# Patient Record
Sex: Female | Born: 1990
Health system: Southern US, Community
[De-identification: ages and names within clinical notes are randomized; demographics above are authoritative.]

## PROBLEM LIST (undated history)

## (undated) DIAGNOSIS — K219 Gastro-esophageal reflux disease without esophagitis: Secondary | ICD-10-CM

## (undated) HISTORY — PX: BUNIONECTOMY: SHX129

---
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
## 2012-04-19 ENCOUNTER — Encounter: Payer: BC Managed Care – PPO | Admitting: Women's Health

## 2012-04-21 ENCOUNTER — Ambulatory Visit (INDEPENDENT_AMBULATORY_CARE_PROVIDER_SITE_OTHER): Payer: BC Managed Care – PPO | Admitting: Women's Health

## 2012-04-21 ENCOUNTER — Telehealth: Payer: Self-pay | Admitting: Women's Health

## 2012-04-21 ENCOUNTER — Encounter: Payer: Self-pay | Admitting: Women's Health

## 2012-04-21 VITALS — BP 110/70 | Ht 64.0 in | Wt 122.0 lb

## 2012-04-21 DIAGNOSIS — Z23 Encounter for immunization: Secondary | ICD-10-CM

## 2012-04-21 NOTE — Progress Notes (Signed)
Patient ID: Kellie [name], female   DOB: 07-31-1991, 21 y.o.   MRN: [id number] Had an annual exam with STD screen at health department on 04/20/12. Requested gardasil. Had received first gardasil in 2010, did not return for followup. Without complaint. Reviewed gardasil not in, currently on backorder, will call when gardasil arrives. Reviewed gardasil 3 series injection, reviewed importance of returning to office in 2 and 6 months after initial injection.

## 2012-04-21 NOTE — Telephone Encounter (Signed)
Telephone call, informed gardasil arrived, schedule appointment for injection. Again reviewed 3 series injection.

## 2012-04-21 NOTE — Patient Instructions (Addendum)
Health Maintenance, 18- to 19-Year-Old SCHOOL PERFORMANCE After high school completion, the Yvette Loveless adult may be attending college, technical or vocational school, or entering the military or the work force. SOCIAL AND EMOTIONAL DEVELOPMENT The Lennis Korb adult establishes adult relationships and explores sexual identity. Olivine Hiers adults may be living at home or in a college dorm or apartment. Increasing independence is important with Akshith Moncus adults. Throughout adolescence, teens should assume responsibility of their own health care. IMMUNIZATIONS Most Nikolas Casher adults should be fully vaccinated. A booster dose of Tdap (tetanus, diphtheria, and pertussis, or "whooping cough"), a dose of meningococcal vaccine to protect against a certain type of bacterial meningitis, hepatitis A, human papillomarvirus (HPV), chickenpox, or measles vaccines may be indicated, if not given at an earlier age. Annual influenza or "flu" vaccination should be considered during flu season.  TESTING Annual screening for vision and hearing problems is recommended. Vision should be screened objectively at least once between 18 and 19 years of age. The Monserrath Junio adult may be screened for anemia or tuberculosis. Ermon Sagan adults should have a blood test to check for high cholesterol during this time period. Tivon Lemoine adults should be screened for use of alcohol and drugs. If the Gloriann Riede adult is sexually active, screening for sexually transmitted infections, pregnancy, or HIV may be performed. Screening for cervical cancer should be performed within 3 years of beginning sexual activity. NUTRITION AND ORAL HEALTH  Adequate calcium intake is important. Consume 3 servings of low-fat milk and dairy products daily. For those who do not drink milk or consume dairy products, calcium enriched foods, such as juice, bread, or cereal, dark, leafy greens, or canned fish are alternate sources of calcium.   Drink plenty of water. Limit fruit juice to 8 to 12 ounces per day.  Avoid sugary beverages or sodas.   Discourage skipping meals, especially breakfast. Teens should eat a good variety of vegetables and fruits, as well as lean meats.   Avoid high fat, high salt, and high sugar foods, such as candy, chips, and cookies.   Encourage Emillio Ngo adults to participate in meal planning and preparation.   Eat meals together as a family whenever possible. Encourage conversation at mealtime.   Limit fast food choices and eating out at restaurants.   Brush teeth twice a day and floss.   Schedule dental exams twice a year.  SLEEP Regular sleep habits are important. PHYSICAL, SOCIAL, AND EMOTIONAL DEVELOPMENT  One hour of regular physical activity daily is recommended. Continue to participate in sports.   Encourage Trevar Boehringer adults to develop their own interests and consider community service or volunteerism.   Provide guidance to the Delaina Fetsch adult in making decisions about college and work plans.   Make sure that Tynika Luddy adults know that they should never be in a situation that makes them uncomfortable, and they should tell partners if they do not want to engage in sexual activity.   Talk to the Cosby Proby adult about body image. Eating disorders may be noted at this time. Cierrah Dace adults may also be concerned about being overweight. Monitor the Austan Nicholl adult for weight gain or loss.   Mood disturbances, depression, anxiety, alcoholism, or attention problems may be noted in Simora Dingee adults. Talk to the caregiver if there are concerns about mental illness.   Negotiate limit setting and independent decision making.   Encourage the Nataliee Shurtz adult to handle conflict without physical violence.   Avoid loud noises which may impair hearing.   Limit television and computer time to 2 hours per day.   Individuals who engage in excessive sedentary activity are more likely to become overweight.  RISK BEHAVIORS  Sexually active Benzion Mesta adults need to take precautions against pregnancy and sexually  transmitted infections. Talk to Nevin Grizzle adults about contraception.   Provide a tobacco-free and drug-free environment for the Jowell Bossi adult. Talk to the Zanyia Silbaugh adult about drug, tobacco, and alcohol use among friends or at friends' homes. Make sure the Dacen Frayre adult knows that smoking tobacco or marijuana and taking drugs have health consequences and may impact brain development.   Teach the Werner Labella adult about appropriate use of over-the-counter or prescription medicines.   Establish guidelines for driving and for riding with friends.   Talk to Sanika Brosious adults about the risks of drinking and driving or boating. Encourage the Talley Kreiser adult to call you if he or she or friends have been drinking or using drugs.   Remind Caedon Bond adults to wear seat belts at all times in cars and life vests in boats.   Marqueze Ramcharan adults should always wear a properly fitted helmet when they are riding a bicycle.   Use caution with all-terrain vehicles (ATVs) or other motorized vehicles.   Do not keep handguns in the home. (If you do, the gun and ammunition should be locked separately and out of the Weslynn Ke adult's access.)   Equip your home with smoke detectors and change the batteries regularly. Make sure all family members know the fire escape plans for your home.   Teach Kennadee Walthour adults not to swim alone and not to dive in shallow water.   All individuals should wear sunscreen that protects against UVA and UVB light with at least a sun protection factor (SPF) of 30 when out in the sun. This minimizes sun burning.  WHAT'S NEXT? Tymeer Vaquera adults should visit their pediatrician or family physician yearly. By Shaunice Levitan adulthood, health care should be transitioned to a family physician or internal medicine specialist. Sexually active females may want to begin annual physical exams with a gynecologist. Document Released: 01/22/2007 Document Revised: 10/16/2011 Document Reviewed: 02/11/2007 Exit[employer] Patient Information 2012 [employer]. 

## 2012-06-10 ENCOUNTER — Ambulatory Visit (INDEPENDENT_AMBULATORY_CARE_PROVIDER_SITE_OTHER): Payer: BC Managed Care – PPO | Admitting: Women's Health

## 2012-06-10 ENCOUNTER — Encounter: Payer: Self-pay | Admitting: Women's Health

## 2012-06-10 DIAGNOSIS — R350 Frequency of micturition: Secondary | ICD-10-CM

## 2012-06-10 DIAGNOSIS — IMO0001 Reserved for inherently not codable concepts without codable children: Secondary | ICD-10-CM

## 2012-06-10 DIAGNOSIS — Z309 Encounter for contraceptive management, unspecified: Secondary | ICD-10-CM

## 2012-06-10 DIAGNOSIS — N390 Urinary tract infection, site not specified: Secondary | ICD-10-CM

## 2012-06-10 DIAGNOSIS — N898 Other specified noninflammatory disorders of vagina: Secondary | ICD-10-CM

## 2012-06-10 LAB — URINALYSIS W MICROSCOPIC + REFLEX CULTURE
Bilirubin Urine: NEGATIVE
Crystals: NONE SEEN
Ketones, ur: NEGATIVE mg/dL
Specific Gravity, Urine: 1.02 (ref 1.005–1.030)
Urobilinogen, UA: 0.2 mg/dL (ref 0.0–1.0)

## 2012-06-10 LAB — WET PREP FOR TRICH, YEAST, CLUE
Clue Cells Wet Prep HPF POC: NONE SEEN
Yeast Wet Prep HPF POC: NONE SEEN

## 2012-06-10 MED ORDER — SULFAMETHOXAZOLE-TMP DS 800-160 MG PO TABS: 1.0000 | ORAL_TABLET | Freq: Two times a day (BID) | ORAL | Status: DC

## 2012-06-10 MED ORDER — NORETHIN-ETH ESTRAD TRIPHASIC 0.5/1/0.5-35 MG-MCG PO TABS: 1.0000 | ORAL_TABLET | Freq: Every day | ORAL | Status: DC

## 2012-06-10 MED ORDER — FLUCONAZOLE 150 MG PO TABS: 150.0000 mg | ORAL_TABLET | Freq: Once | ORAL | Status: AC

## 2012-06-10 NOTE — Patient Instructions (Addendum)

## 2012-06-10 NOTE — Progress Notes (Signed)
Patient ID: Kellie [name], female   DOB: 11-26-1990, 21 y.o.   MRN: [id number] Presents with the complaint of  increased urinary frequency with pain and burning for several days. Also states is having vaginal discharge with irritation. New partner, had a negative HIV, hepatitis and RPR at the health department but states is not sure if they did cultures. Currently on Tri-Norinyl without a problem. Denies a fever. Recently had bilateral bunionectomy and has been on antibiotics. First gardasil given in June.  Exam: Incision on left foot well approximated and healing, right foot covered and wearing a boot. No CVAT, abdomen soft nontender, external genitalia erythematous at introitus, speculum exam vaginal discharge white without odor, vaginal walls erythematous GC/Chlamydia culture taken. Wet prep negative. Bimanual no CMT or adnexal fullness or tenderness. UA: WBCs TNTC, moderate leukocytes many bacteria.  UTI Symptomatic yeast  Plan: Septra DS one by mouth twice a day for 3 days, Diflucan 150 by mouth x1 dose. Keep scheduled followup with orthopedist. Second  gardasil given, instructed to return to the office in 4 months to complete series. Continue on the Tri-Norinyl, condoms encouraged until permanent partner.

## 2012-06-11 LAB — GC/CHLAMYDIA PROBE AMP, GENITAL: GC Probe Amp, Genital: NEGATIVE

## 2012-06-17 LAB — URINE CULTURE

## 2012-11-05 ENCOUNTER — Ambulatory Visit (INDEPENDENT_AMBULATORY_CARE_PROVIDER_SITE_OTHER): Payer: BC Managed Care – PPO | Admitting: Gynecology

## 2012-11-05 DIAGNOSIS — Z23 Encounter for immunization: Secondary | ICD-10-CM

## 2013-05-26 ENCOUNTER — Ambulatory Visit (INDEPENDENT_AMBULATORY_CARE_PROVIDER_SITE_OTHER): Payer: BC Managed Care – PPO | Admitting: *Deleted

## 2013-05-26 DIAGNOSIS — Z23 Encounter for immunization: Secondary | ICD-10-CM

## 2013-10-10 ENCOUNTER — Encounter: Payer: Self-pay | Admitting: Podiatry

## 2013-10-10 ENCOUNTER — Ambulatory Visit (INDEPENDENT_AMBULATORY_CARE_PROVIDER_SITE_OTHER): Payer: BC Managed Care – PPO

## 2013-10-10 ENCOUNTER — Ambulatory Visit (INDEPENDENT_AMBULATORY_CARE_PROVIDER_SITE_OTHER): Payer: BC Managed Care – PPO | Admitting: Podiatry

## 2013-10-10 VITALS — BP 124/77 | HR 69 | Resp 12 | Ht 65.0 in

## 2013-10-10 DIAGNOSIS — R52 Pain, unspecified: Secondary | ICD-10-CM

## 2013-10-10 DIAGNOSIS — M2040 Other hammer toe(s) (acquired), unspecified foot: Secondary | ICD-10-CM

## 2013-10-10 NOTE — Progress Notes (Signed)
N- SORE, BURNING SORE, SWOLLEN L-LT FOOT 3RD TOE D- 2 WEEKS O-SLOWLY C-BETTER A-WALKING T-N/A

## 2013-10-12 NOTE — Progress Notes (Signed)
Subjective:     Patient ID: Kellie [name], female   DOB: 18-Jan-1991, 22 y.o.   MRN: [id number]  HPI patient presents stating I was having some discomfort in my left foot and third toe and wanted to get it checked. She is several years after having extensive forefoot reconstruction of both feet   Review of Systems     Objective:   Physical Exam Neurovascular status was found to be intact with muscle strength adequate. No equinus condition was noted in all incision sites have healed well   the third toe left is mildly plantar flexed but in a good position in the frontal plane with mild edema associated with it  Assessment:     Possibility of trauma to the third toe which may be creating some irritation    Plan:     H&P and x-ray reviewed. Advised on wider shoes and soaks and symptoms should reduce. If continuing she is to let me know and we will look more into this problem

## 2013-11-15 ENCOUNTER — Encounter: Payer: Self-pay | Admitting: Women's Health

## 2013-11-15 ENCOUNTER — Ambulatory Visit (INDEPENDENT_AMBULATORY_CARE_PROVIDER_SITE_OTHER): Payer: BC Managed Care – PPO | Admitting: Women's Health

## 2013-11-15 DIAGNOSIS — N760 Acute vaginitis: Secondary | ICD-10-CM

## 2013-11-15 DIAGNOSIS — B3731 Acute candidiasis of vulva and vagina: Secondary | ICD-10-CM

## 2013-11-15 DIAGNOSIS — B373 Candidiasis of vulva and vagina: Secondary | ICD-10-CM

## 2013-11-15 DIAGNOSIS — B9689 Other specified bacterial agents as the cause of diseases classified elsewhere: Secondary | ICD-10-CM

## 2013-11-15 DIAGNOSIS — A499 Bacterial infection, unspecified: Secondary | ICD-10-CM

## 2013-11-15 LAB — WET PREP FOR TRICH, YEAST, CLUE
Trich, Wet Prep: NONE SEEN
Yeast Wet Prep HPF POC: NONE SEEN

## 2013-11-15 MED ORDER — FLUCONAZOLE 150 MG PO TABS: 150.0000 mg | ORAL_TABLET | Freq: Once | ORAL | Status: DC

## 2013-11-15 MED ORDER — METRONIDAZOLE 0.75 % VA GEL: VAGINAL | Status: DC

## 2013-11-15 NOTE — Patient Instructions (Signed)
Bacterial Vaginosis Bacterial vaginosis is an infection of the vagina. A healthy vagina has many kinds of good germs (bacteria). Sometimes the number of good germs can change. This allows bad germs to move in and cause an infection. You may be given medicine (antibiotics) to treat the infection. Or, you may not need treatment at all. HOME CARE  Take your medicine as told. Finish them even if you start to feel better.  Do not have sex until you finish your medicine.  Do not douche.  Practice safe sex.  Tell your sex partner that you have an infection. They should see their doctor for treatment if they have problems. GET HELP RIGHT AWAY IF:  You do not get better after 3 days of treatment.  You have grey fluid (discharge) coming from your vagina.  You have pain.  You have a temperature of 102 F (38.9 C) or higher. MAKE SURE YOU:   Understand these instructions.  Will watch your condition.  Will get help right away if you are not doing well or get worse. Document Released: 08/05/2008 Document Revised: 01/19/2012 Document Reviewed: 06/08/2013 [employer] Patient Information 2014 [employer].  

## 2013-11-15 NOTE — Progress Notes (Signed)
Patient ID: Kellie [name], female   DOB: 09/20/1991, 23 y.o.   MRN: [id number] Presents with complaint of vaginal discomfort and bump on perineum that has resolved. Monthly cycle on Ortho Evra patch, difficulty with adherence/using tape to keep adhered. Gardasil series completed. Recent annual exam and treatment for UTI at primary care.  Exam: Appears well. External genitalia slightly erythematous at introitus no visible lesion, swelling, or bump noted. Speculum exam copious white adherent discharge noted with odor. Wet prep positive for amines, clues, and TNTC bacteria. Bimanual no CMT or adnexal fullness or tenderness.  Bacteria vaginosis Contraception management  Plan: MetroGel vaginal cream 1 applicator at bedtime x5, alcohol precautions reviewed, Diflucan 150 by mouth times one dose if vaginal itching occurs. Contraception options reviewed, would like nexplanon , information given and reviewed, Dr. Audie BoxFontaine to place with next cycle.

## 2013-11-30 ENCOUNTER — Encounter: Payer: Self-pay | Admitting: Women's Health

## 2013-11-30 ENCOUNTER — Ambulatory Visit (INDEPENDENT_AMBULATORY_CARE_PROVIDER_SITE_OTHER): Payer: BC Managed Care – PPO | Admitting: Women's Health

## 2013-11-30 VITALS — Ht 65.0 in | Wt 123.6 lb

## 2013-11-30 DIAGNOSIS — Z309 Encounter for contraceptive management, unspecified: Secondary | ICD-10-CM

## 2013-11-30 DIAGNOSIS — IMO0001 Reserved for inherently not codable concepts without codable children: Secondary | ICD-10-CM

## 2013-11-30 MED ORDER — MEDROXYPROGESTERONE ACETATE 150 MG/ML IM SUSP: 150.0000 mg | INTRAMUSCULAR | Status: DC

## 2013-11-30 NOTE — Progress Notes (Signed)
Patient ID: Kellie [name], female   DOB: Oct 14, 1991, 23 y.o.   MRN: [id number] Presents for contraceptive consultation.  Normal monthly cycle/Ortho Evra patch from [facility] department/not sexually active. Would like to start Depo shot. Reports normal annual exam 10/2013 at health department. Gardasil series completed.  Exam:  Appears well.  Contraceptive management  Plan: Depo-Provera 150mg  injection q 12 weeks.  Slight risk of blood clots, wt gain, irregular spotting reviewed.  Prescription given, return to office with cycle, and every 12 weeks.  Encouraged condoms when sexually active

## 2013-11-30 NOTE — Patient Instructions (Signed)

## 2013-12-02 ENCOUNTER — Ambulatory Visit (INDEPENDENT_AMBULATORY_CARE_PROVIDER_SITE_OTHER): Payer: BC Managed Care – PPO | Admitting: Gynecology

## 2013-12-02 DIAGNOSIS — Z309 Encounter for contraceptive management, unspecified: Secondary | ICD-10-CM

## 2013-12-02 DIAGNOSIS — IMO0001 Reserved for inherently not codable concepts without codable children: Secondary | ICD-10-CM

## 2013-12-02 MED ORDER — MEDROXYPROGESTERONE ACETATE 150 MG/ML IM SUSP
150.0000 mg | Freq: Once | INTRAMUSCULAR | Status: AC
  Administered 2013-12-02: 150 mg via INTRAMUSCULAR

## 2014-02-14 ENCOUNTER — Telehealth: Payer: Self-pay

## 2014-02-14 DIAGNOSIS — IMO0001 Reserved for inherently not codable concepts without codable children: Secondary | ICD-10-CM

## 2014-02-14 MED ORDER — MEDROXYPROGESTERONE ACETATE 150 MG/ML IM SUSP: 150.0000 mg | Freq: Once | INTRAMUSCULAR | Status: DC

## 2014-02-14 NOTE — Telephone Encounter (Signed)
Lupita LeashDonna to call her to schedule CE. Looks like there might be time on [place] schedule for 3:00pm on Thursday and she has scheduled a 2:30pm nurse appt. For inj.

## 2014-02-14 NOTE — Telephone Encounter (Signed)
Patient called stating she needs refill on her D-P injection so she can come this Thursday for injection. It appears her last RGCE was 04/21/2012.  She has come regularly for these injections.  Ok to refill in light of so overdue?

## 2014-02-14 NOTE — Telephone Encounter (Signed)
Okay to refill and have her schedule annual exam  day that her depo is due if possible.

## 2014-02-16 ENCOUNTER — Ambulatory Visit: Payer: BC Managed Care – PPO

## 2014-02-16 ENCOUNTER — Encounter: Payer: Self-pay | Admitting: Women's Health

## 2014-02-16 ENCOUNTER — Ambulatory Visit (INDEPENDENT_AMBULATORY_CARE_PROVIDER_SITE_OTHER): Payer: BC Managed Care – PPO | Admitting: Women's Health

## 2014-02-16 VITALS — BP 115/75 | Ht 65.0 in | Wt 121.0 lb

## 2014-02-16 DIAGNOSIS — Z309 Encounter for contraceptive management, unspecified: Secondary | ICD-10-CM

## 2014-02-16 DIAGNOSIS — IMO0001 Reserved for inherently not codable concepts without codable children: Secondary | ICD-10-CM

## 2014-02-16 DIAGNOSIS — Z01419 Encounter for gynecological examination (general) (routine) without abnormal findings: Secondary | ICD-10-CM

## 2014-02-16 LAB — CBC WITH DIFFERENTIAL/PLATELET
Basophils Absolute: 0 10*3/uL (ref 0.0–0.1)
Basophils Relative: 0 % (ref 0–1)
EOS ABS: 0.1 10*3/uL (ref 0.0–0.7)
Eosinophils Relative: 1 % (ref 0–5)
HCT: 37.3 % (ref 36.0–46.0)
HEMOGLOBIN: 13.1 g/dL (ref 12.0–15.0)
LYMPHS ABS: 2.8 10*3/uL (ref 0.7–4.0)
LYMPHS PCT: 44 % (ref 12–46)
MCH: 30.2 pg (ref 26.0–34.0)
MCHC: 35.1 g/dL (ref 30.0–36.0)
MCV: 85.9 fL (ref 78.0–100.0)
MONOS PCT: 6 % (ref 3–12)
Monocytes Absolute: 0.4 10*3/uL (ref 0.1–1.0)
NEUTROS ABS: 3.1 10*3/uL (ref 1.7–7.7)
NEUTROS PCT: 49 % (ref 43–77)
PLATELETS: 211 10*3/uL (ref 150–400)
RBC: 4.34 MIL/uL (ref 3.87–5.11)
RDW: 13.9 % (ref 11.5–15.5)
WBC: 6.3 10*3/uL (ref 4.0–10.5)

## 2014-02-16 MED ORDER — MEDROXYPROGESTERONE ACETATE 150 MG/ML IM SUSP: 150.0000 mg | Freq: Once | INTRAMUSCULAR | Status: DC

## 2014-02-16 MED ORDER — MEDROXYPROGESTERONE ACETATE 150 MG/ML IM SUSP
150.0000 mg | Freq: Once | INTRAMUSCULAR | Status: AC
  Administered 2014-02-16: 150 mg via INTRAMUSCULAR

## 2014-02-16 NOTE — Patient Instructions (Signed)
Health Maintenance, 44- to 21-Year-Old SCHOOL PERFORMANCE After high school completion, the Kellie Kellie [name] adult may be attending college, Hotel manager or vocational school, or entering the [employer] or the work force. SOCIAL AND EMOTIONAL DEVELOPMENT The Kellie Kellie [name] adult establishes adult relationships and explores sexual identity. Kellie Kellie [name] Kellie [name] may be living at home or in a college dorm or apartment. Increasing independence is important with Kellie Kellie [name] Kellie [name]. Throughout these years, Kellie Kellie [name] should assume responsibility of their own health care. RECOMMENDED IMMUNIZATIONS  Influenza vaccine.  All Kellie [name] should be immunized every year.  All Kellie [name], including pregnant women and people with hives-only allergy to eggs can receive the inactivated influenza (IIV) vaccine.  Kellie [name] aged 44 49 years can receive the recombinant influenza (RIV) vaccine. The RIV vaccine does not contain any egg protein.  Tetanus, diphtheria, and acellular pertussis (Td, Tdap) vaccine.  Pregnant women should receive 1 dose of Tdap vaccine during each pregnancy. The dose should be obtained regardless of the length of time since the last dose. Immunization is preferred during the 27th to 36th week of gestation.  An adult who has not previously received Tdap or who does not know his or her vaccine status should receive 1 dose of Tdap. This initial dose should be followed by tetanus and diphtheria toxoids (Td) booster doses every 10 years.  Kellie [name] with an unknown or incomplete history of completing a 3-dose immunization series with Td-containing vaccines should begin or complete a primary immunization series including a Tdap dose.  Kellie [name] should receive a Td booster every 10 years.  Varicella vaccine.  An adult without evidence of immunity to varicella should receive 2 doses or a second dose if he or she has previously received 1 dose.  Pregnant females who do not have evidence of immunity should receive the first dose after pregnancy.  This first dose should be obtained before leaving the health care facility. The second dose should be obtained 4 8 weeks after the first dose.  Human papillomavirus (HPV) vaccine.  Females aged 15 26 years who have not received the vaccine previously should obtain the 3-dose series.  The vaccine is not recommended for use in pregnant females. However, pregnancy testing is not needed before receiving a dose. If a female is found to be pregnant after receiving a dose, no treatment is needed. In that case, the remaining doses should be delayed until after the pregnancy.  Males aged 12 21 years who have not received the vaccine previously should receive the 3-dose series. Males aged 39 26 years may be immunized.  Immunization is recommended through the age of 1 years for any female who has sex with males and did not get any or all doses earlier.  Immunization is recommended for any person with an immunocompromised condition through the age of 27 years if he or she did not get any or all doses earlier.  During the 3-dose series, the second dose should be obtained 4 8 weeks after the first dose. The third dose should be obtained 24 weeks after the first dose and 16 weeks after the second dose.  Measles, mumps, and rubella (MMR) vaccine.  Kellie [name] born in [birth year] or later should have 1 or more doses of MMR vaccine unless there is a contraindication to the vaccine or there is laboratory evidence of immunity to each of the three diseases.  A routine second dose of MMR vaccine should be obtained at least 28 days after the first dose for students attending postsecondary schools, health care workers, or international travelers.  For females of childbearing age, rubella immunity should be determined. If there is no evidence of immunity, females who are not pregnant should be vaccinated. If there is no evidence of immunity, females who are pregnant should delay immunization until after pregnancy.  Pneumococcal  13-valent conjugate (PCV13) vaccine.  When indicated, a person who is uncertain of his or her immunization history and has no record of immunization should receive the PCV13 vaccine.  An adult aged 19 years or older who has certain medical conditions and has not been previously immunized should receive 1 dose of PCV13 vaccine. This PCV13 should be followed with a dose of pneumococcal polysaccharide (PPSV23) vaccine. The PPSV23 vaccine dose should be obtained at least 8 weeks after the dose of PCV13 vaccine.  An adult aged 19 years or older who has certain medical conditions and previously received 1 or more doses of PPSV23 vaccine should receive 1 dose of PCV13. The PCV13 vaccine dose should be obtained 1 or more years after the last PPSV23 vaccine dose.  Pneumococcal polysaccharide (PPSV23) vaccine.  When PCV13 is also indicated, PCV13 should be obtained first.  An adult younger than age 65 years who has certain medical conditions should be immunized.  Any person who resides in a nursing home or long-term care facility should be immunized.  An adult smoker should be immunized.  People with an immunocompromised condition and certain other conditions should receive both PCV13 and PPSV23 vaccines.  People with human immunodeficiency virus (HIV) infection should be immunized as soon as possible after diagnosis.  Immunization during chemotherapy or radiation therapy should be avoided.  Routine use of PPSV23 vaccine is not recommended for American Indians, [place] Natives, or people younger than 65 years unless there are medical conditions that require PPSV23 vaccine.  When indicated, people who have unknown immunization and have no record of immunization should receive PPSV23 vaccine.  One-time revaccination 5 years after the first dose of PPSV23 is recommended for people aged 19 64 years who have chronic kidney failure, nephrotic syndrome, asplenia, or immunocompromised  conditions.  Meningococcal vaccine.  Kellie [name] with asplenia or persistent complement component deficiencies should receive 2 doses of quadrivalent meningococcal conjugate (MenACWY-D) vaccine. The doses should be obtained at least 2 months apart.  Microbiologists working with certain meningococcal bacteria, military recruits, people at risk during an outbreak, and people who travel to or live in countries with a high rate of meningitis should be immunized.  A first-year college student up through age 21 years who is living in a residence hall should receive a dose if he or she did not receive a dose on or after his or her 16th birthday.  Kellie [name] who have certain high-risk conditions should receive one or more doses of vaccine.  Hepatitis A vaccine.  Kellie [name] who wish to be protected from this disease, have certain high-risk conditions, work with hepatitis A-infected animals, work in hepatitis A research labs, or travel to or work in countries with a high rate of hepatitis A should be immunized.  Kellie [name] who were previously unvaccinated and who anticipate close contact with an international adoptee during the first 60 days after arrival in the [place] from a country with a high rate of hepatitis A should be immunized.  Hepatitis B vaccine.  Kellie [name] who wish to be protected from this disease, have certain high-risk conditions, may be exposed to blood or other infectious body fluids, are household contacts or sex partners of hepatitis B positive people, are clients or workers in   certain care facilities, or travel to or work in countries with a high rate of hepatitis B should be immunized.  Haemophilus influenzae type b (Hib) vaccine.  A previously unvaccinated person with asplenia or sickle cell disease or having a scheduled splenectomy should receive 1 dose of Hib vaccine.  Regardless of previous immunization, a recipient of a hematopoietic stem cell transplant should receive a 3-dose series 6  12 months after his or her successful transplant.  Hib vaccine is not recommended for Kellie [name] with HIV infection. TESTING Annual screening for vision and hearing problems is recommended. Vision should be screened objectively at least once between 18 21 years of age. The Kellie Kellie [name] adult may be screened for anemia or tuberculosis. Kellie Kellie [name] Kellie [name] should have a blood test to check for high cholesterol during this time period. Kellie Kellie [name] Kellie [name] should be screened for use of alcohol and drugs. If the Kellie Kellie [name] adult is sexually active, screening for sexually transmitted infections, pregnancy, or HIV may be performed.  NUTRITION AND ORAL HEALTH  Adequate calcium intake is important. Consume 3 servings of low-fat milk and dairy products daily. For those who do not drink milk or consume dairy products, calcium enriched foods, such as juice, bread, or cereal, dark, leafy greens, or canned fish are alternate sources of calcium.  Drink plenty of water. Limit fruit juice to 8 12 ounces (240 360 mL) each day. Avoid sugary beverages or sodas.  Discourage skipping meals, especially breakfast. Kellie Kellie [name] Kellie [name] should eat a good variety of vegetables and fruits, as well as lean meats.  Avoid foods high in fat, salt, or sugar, such as candy, chips, and cookies.  Encourage Kellie Kellie [name] Kellie [name] to participate in meal planning and preparation.  Eat meals together as a family whenever possible. Encourage conversation at mealtime.  Limit fast food choices and eating out at restaurants.  Brush teeth twice a day and floss.  Schedule dental exams twice a year. SLEEP Regular sleep habits are important. PHYSICAL, SOCIAL, AND EMOTIONAL DEVELOPMENT  One hour of regular physical activity daily is recommended. Continue to participate in sports.  Encourage Kellie Kellie [name] Kellie [name] to develop their own interests and consider community service or volunteerism.  Provide guidance to the Kellie Kellie [name] adult in making decisions about college and work plans.  Make sure  that Kellie Kellie [name] Kellie [name] know that they should never be in a situation that makes them uncomfortable, and they should tell partners if they do not want to engage in sexual activity.  Talk to the Kellie Kellie [name] adult about body image. Eating disorders may be noted at this time. Kellie Kellie [name] Kellie [name] may also be concerned about being overweight. Monitor the Kellie Kellie [name] [place] adult for weight gain or loss.  Mood disturbances, depression, anxiety, alcoholism, or attention problems may be noted in Emslee Lopezmartinez Kellie [name]. Talk to the caregiver if there are concerns about mental illness.  Negotiate limit setting and independent decision making.  Encourage the Nekeya Briski adult to handle conflict without physical violence.  Avoid loud noises which may impair hearing.  Limit television and computer time to 2 hours each day. Individuals who engage in excessive sedentary activity are more likely to become overweight. RISK BEHAVIORS  Sexually active Xaiden Fleig Kellie [name] need to take precautions against pregnancy and sexually transmitted infections. Talk to Cathryn Gallery Kellie [name] about contraception.  Provide a tobacco-free and drug-free environment for the Nomar Broad adult. Talk to the Odie Rauen adult about drug, tobacco, and alcohol use among friends or at friend's homes. Make sure the Kristelle Cavallaro adult knows that smoking tobacco or marijuana and taking drugs have health consequences and   may impact brain development.  Teach the Crew Goren adult about appropriate use of over-the-counter or prescription medicines.  Establish guidelines for driving and for riding with friends.  Talk to Krisalyn Yankowski Kellie [name] about the risks of drinking and driving or boating. Encourage the Uma Jerde adult to call you if he or she or friends have been drinking or using drugs.  Remind Gracin Mcpartland Kellie [name] to wear seat belts at all times in cars and life vests in boats.  Marvia Troost Kellie [name] should always wear a properly fitted helmet when they are riding a bicycle.  Use caution with all-terrain vehicles (ATVs) or other motorized  vehicles.  Do not keep handguns in the home. (If you do, the gun and ammunition should be locked separately and out of the Desarai Barrack adult's access.)  Equip your home with smoke detectors and change the batteries regularly. Make sure all family members know the fire escape plans for your home.  Teach Waunetta Riggle Kellie [name] not to swim alone and not to dive in shallow water.  All individuals should wear sunscreen when out in the sun. This minimizes sunburning. WHAT'S NEXT? Adayah Arocho Kellie [name] should visit their pediatrician or family physician yearly. By Annemarie Sebree adulthood, health care should be transitioned to a family physician or internal medicine specialist. Sexually active females may want to begin annual physical exams with a gynecologist. Document Released: 01/22/2007 Document Revised: 02/21/2013 Document Reviewed: 02/11/2007 Exit[employer] Patient Information 2014 [employer].  

## 2014-02-16 NOTE — Progress Notes (Signed)
Kellie [name] [birth year] [id number]    History:    Presents for annual exam.  Occasional bleeding with Depo-Provera. Same partner. Gardasil series completed.  Past medical history, past surgical history, family history and social history were all reviewed and documented in the EPIC chart. 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 Native American [place], denies immediate family  history of diabetes.  ROS:  A  ROS was performed and pertinent positives and negatives are included.  Exam:  Filed Vitals:   02/16/14 1513  BP: 115/75    General appearance:  Normal Thyroid:  Symmetrical, normal in size, without palpable masses or nodularity. Respiratory  Auscultation:  Clear without wheezing or rhonchi Cardiovascular  Auscultation:  Regular rate, without rubs, murmurs or gallops  Edema/varicosities:  Not grossly evident Abdominal  Soft,nontender, without masses, guarding or rebound.  Liver/spleen:  No organomegaly noted  Hernia:  None appreciated  Skin  Inspection:  Grossly normal   Breasts: Examined lying and sitting.     Right: Without masses, retractions, discharge or axillary adenopathy.     Left: Without masses, retractions, discharge or axillary adenopathy. Gentitourinary   Inguinal/mons:  Normal without inguinal adenopathy  External genitalia:  Normal  BUS/Urethra/Skene's glands:  Normal  Vagina:  Normal  Cervix:  Normal  Uterus:  normal in size, shape and contour.  Midline and mobile  Adnexa/parametria:     Rt: Without masses or tenderness.   Lt: Without masses or tenderness.  Anus and perineum: Normal   Assessment/Plan:  23 y.o. SHF G0 for annual exam with no complaints.  Rare bleeding with Depo-Provera  Plan: Depo-Provera 150 mg IM every 12 weeks prescription, proper use, reviewed importance of increasing calcium rich foods and diet, calcium supplement while on Depo-Provera. Given today right hip. SBE's, regular exercise, MVI daily encouraged.  CBC, UA. Dating and driving safety reviewed.    Harrington Challengerancy J Aaro Meyers [facility] LLCWHNP, 5:45 PM 02/16/2014

## 2014-02-17 LAB — URINALYSIS W MICROSCOPIC + REFLEX CULTURE
Bacteria, UA: NONE SEEN
Bilirubin Urine: NEGATIVE
CASTS: NONE SEEN
CRYSTALS: NONE SEEN
GLUCOSE, UA: NEGATIVE mg/dL
Hgb urine dipstick: NEGATIVE
Ketones, ur: NEGATIVE mg/dL
LEUKOCYTES UA: NEGATIVE
NITRITE: NEGATIVE
PH: 7.5 (ref 5.0–8.0)
Protein, ur: NEGATIVE mg/dL
SPECIFIC GRAVITY, URINE: 1.008 (ref 1.005–1.030)
SQUAMOUS EPITHELIAL / LPF: NONE SEEN
Urobilinogen, UA: 1 mg/dL (ref 0.0–1.0)

## 2014-05-08 ENCOUNTER — Telehealth: Payer: Self-pay | Admitting: *Deleted

## 2014-05-08 NOTE — Telephone Encounter (Signed)
Pt called requesting refill on depo provera, pt has refills at pharmacy for a full year until next annual. I tried to call pt to inform, but her voicemail was not set up to leave message.

## 2014-05-11 ENCOUNTER — Ambulatory Visit (INDEPENDENT_AMBULATORY_CARE_PROVIDER_SITE_OTHER): Payer: BC Managed Care – PPO | Admitting: Anesthesiology

## 2014-05-11 DIAGNOSIS — Z3042 Encounter for surveillance of injectable contraceptive: Secondary | ICD-10-CM

## 2014-05-11 DIAGNOSIS — Z3049 Encounter for surveillance of other contraceptives: Secondary | ICD-10-CM

## 2014-05-11 MED ORDER — MEDROXYPROGESTERONE ACETATE 150 MG/ML IM SUSP
150.0000 mg | Freq: Once | INTRAMUSCULAR | Status: AC
  Administered 2014-05-11: 150 mg via INTRAMUSCULAR

## 2014-07-27 ENCOUNTER — Ambulatory Visit (INDEPENDENT_AMBULATORY_CARE_PROVIDER_SITE_OTHER): Payer: BC Managed Care – PPO | Admitting: *Deleted

## 2014-07-27 DIAGNOSIS — Z3042 Encounter for surveillance of injectable contraceptive: Secondary | ICD-10-CM

## 2014-07-27 DIAGNOSIS — Z3049 Encounter for surveillance of other contraceptives: Secondary | ICD-10-CM

## 2014-07-27 MED ORDER — MEDROXYPROGESTERONE ACETATE 150 MG/ML IM SUSP
150.0000 mg | Freq: Once | INTRAMUSCULAR | Status: AC
  Administered 2014-07-27: 150 mg via INTRAMUSCULAR

## 2014-10-23 ENCOUNTER — Ambulatory Visit (INDEPENDENT_AMBULATORY_CARE_PROVIDER_SITE_OTHER): Payer: BC Managed Care – PPO | Admitting: Anesthesiology

## 2014-10-23 DIAGNOSIS — Z3042 Encounter for surveillance of injectable contraceptive: Secondary | ICD-10-CM

## 2014-10-23 MED ORDER — MEDROXYPROGESTERONE ACETATE 150 MG/ML IM SUSP
150.0000 mg | Freq: Once | INTRAMUSCULAR | Status: AC
  Administered 2014-10-23: 150 mg via INTRAMUSCULAR

## 2014-11-28 ENCOUNTER — Encounter (HOSPITAL_COMMUNITY): Payer: Self-pay | Admitting: *Deleted

## 2014-11-28 ENCOUNTER — Emergency Department (HOSPITAL_COMMUNITY)
Admission: EM | Admit: 2014-11-28 | Discharge: 2014-11-28 | Disposition: A | Discharge status: Home / Self Care | Payer: BLUE CROSS/BLUE SHIELD | Attending: Emergency Medicine | Admitting: Emergency Medicine

## 2014-11-28 DIAGNOSIS — R112 Nausea with vomiting, unspecified: Secondary | ICD-10-CM | POA: Insufficient documentation

## 2014-11-28 DIAGNOSIS — H6691 Otitis media, unspecified, right ear: Secondary | ICD-10-CM

## 2014-11-28 DIAGNOSIS — J3489 Other specified disorders of nose and nasal sinuses: Secondary | ICD-10-CM | POA: Insufficient documentation

## 2014-11-28 DIAGNOSIS — H9201 Otalgia, right ear: Secondary | ICD-10-CM | POA: Diagnosis present

## 2014-11-28 DIAGNOSIS — F419 Anxiety disorder, unspecified: Secondary | ICD-10-CM | POA: Insufficient documentation

## 2014-11-28 DIAGNOSIS — J029 Acute pharyngitis, unspecified: Secondary | ICD-10-CM | POA: Diagnosis not present

## 2014-11-28 MED ORDER — ONDANSETRON HCL 4 MG PO TABS
4.0000 mg | ORAL_TABLET | Freq: Once | ORAL | Status: AC
  Administered 2014-11-28: 4 mg via ORAL
  Filled 2014-11-28: qty 1

## 2014-11-28 MED ORDER — HYDROCODONE-ACETAMINOPHEN 5-325 MG PO TABS: 1.0000 | ORAL_TABLET | ORAL | Status: DC | PRN

## 2014-11-28 MED ORDER — HYDROCODONE-ACETAMINOPHEN 5-325 MG PO TABS
1.0000 | ORAL_TABLET | Freq: Once | ORAL | Status: AC
  Administered 2014-11-28: 1 via ORAL
  Filled 2014-11-28: qty 1

## 2014-11-28 MED ORDER — AMOXICILLIN-POT CLAVULANATE 875-125 MG PO TABS: 1.0000 | ORAL_TABLET | Freq: Two times a day (BID) | ORAL | Status: DC

## 2014-11-28 NOTE — ED Notes (Signed)
After signing d/c summary patient requesting something else for pain, stating "what you gave me eased some of the pain but not much. Explained to patient medication would take from 30-60 minutes for desired affect. Informed patient this writer would ask PA-C about additional pain relief. Patient verbally upset "just forget it, don't worry about it". Patient ambulatory, accompanied by mother upon d/c in NAD.

## 2014-11-28 NOTE — ED Notes (Signed)
Pt reports acute onset of rt ear pain that began at 0230 this morning. Pt admits to x2 episodes of n/v en route however states it is related to the pain. Pt's mother states she gave her a dose of her "old amoxicillin" at home prior to arrival. Pt unsure of fever - admits to recent cough and congestion as well.

## 2014-11-28 NOTE — Discharge Instructions (Signed)

## 2014-11-28 NOTE — ED Provider Notes (Signed)
CSN: [id number]     Arrival date & time 11/28/14  0441 History   First MD Initiated Contact with Patient 11/28/14 0445     Chief Complaint  Patient presents with  . Otalgia    (Consider location/radiation/quality/duration/timing/severity/associated sxs/prior Treatment) HPI Comments: Patient is a 24 year old female who presents to the emergency department for further evaluation of right ear pain which began suddenly at 0230. Patient states her symptoms have been preceded by nasal congestion, rhinorrhea, and sore throat as well as cough. She states that she took a dose of an old amoxicillin prescription prior to arrival for symptoms with no relief. She had 2 episodes of nonbloody emesis as a result of worsening pain. No further emesis since arrival to ED, though patient continues to complain of mild nausea. No associated fever, ear discharge, swelling of the outer ear, inability to swallow, or drooling.  Patient is a 24 y.o. female presenting with ear pain. The history is provided by the patient. No language interpreter was used.  Otalgia Location:  Right Behind ear:  No abnormality Quality:  Sharp and throbbing Severity:  Moderate Onset quality:  Sudden Duration:  3 hours Timing:  Constant Progression:  Unchanged Chronicity:  New Relieved by:  Nothing Worsened by:  Palpation Ineffective treatments: "old amoxicillin" Rx. Associated symptoms: congestion, cough, rhinorrhea, sore throat and vomiting   Associated symptoms: no abdominal pain, no ear discharge, no fever, no rash and no tinnitus     History reviewed. No pertinent past medical history. Past Surgical History  Procedure Laterality Date  . Foot surgery     Family History  Problem Relation Age of Onset  . Cancer Maternal Aunt     ovarian  . Diabetes Paternal Aunt   . Breast cancer Paternal Aunt   . Diabetes Paternal Uncle   . Diabetes Maternal Grandmother   . Cancer Maternal Grandmother     colon  . Heart disease  Maternal Grandmother   . Heart disease Maternal Grandfather   . Diabetes Paternal Grandmother   . Heart disease Paternal Grandmother   . Diabetes Paternal Grandfather   . Heart disease Paternal Grandfather   . Breast cancer Paternal Aunt    History  Substance Use Topics  . Smoking status: Never Smoker   . Smokeless tobacco: Never Used  . Alcohol Use: No     Comment: social   OB History    Gravida Para Term Preterm AB TAB SAB Ectopic Multiple Living   0               Review of Systems  Constitutional: Negative for fever.  HENT: Positive for congestion, ear pain, rhinorrhea and sore throat. Negative for ear discharge and tinnitus.   Respiratory: Positive for cough.   Gastrointestinal: Positive for nausea and vomiting. Negative for abdominal pain.  Musculoskeletal: Negative for neck stiffness.  Skin: Negative for rash.  All other systems reviewed and are negative.   Allergies  Review of patient's allergies indicates no known allergies.  Home Medications   Prior to Admission medications   Medication Sig Start Date End Date Taking? Authorizing Provider  amoxicillin-clavulanate (AUGMENTIN) 875-125 MG per tablet Take 1 tablet by mouth every 12 (twelve) hours. 11/28/14   Antony MaduraKelly Drayke Grabel, PA-C  HYDROcodone-acetaminophen (NORCO/VICODIN) 5-325 MG per tablet Take 1 tablet by mouth every 4 (four) hours as needed for moderate pain (Take every 4-6 hours for pain). 11/28/14   Antony MaduraKelly Natalie Mceuen, PA-C  ibuprofen (ADVIL,MOTRIN) 200 MG tablet Take 200 mg by mouth every  6 (six) hours as needed. For pain    Historical Provider, MD  medroxyPROGESTERone (DEPO-PROVERA) 150 MG/ML injection Inject 1 mL (150 mg total) into the muscle once. 02/16/14   Harrington Challenger, NP   BP 132/91 mmHg  Pulse 73  Temp(Src) 97.8 F (36.6 C) (Oral)  Resp 20  SpO2 100%   Physical Exam  Constitutional: She is oriented to person, place, and time. She appears well-developed and well-nourished. No distress.  HENT:  Head:  Normocephalic and atraumatic.  Right Ear: External ear normal. No swelling. No mastoid tenderness. Tympanic membrane is erythematous and bulging. Tympanic membrane is not scarred, not perforated and not retracted. A middle ear effusion is present.  Left Ear: Tympanic membrane, external ear and ear canal normal. No swelling. No mastoid tenderness.  Audible nasal congestion with patent nares b/l  Eyes: Conjunctivae and EOM are normal. No scleral icterus.  Neck: Normal range of motion.  No nuchal rigidity or meningismus  Cardiovascular: Normal rate, regular rhythm and normal heart sounds.   Pulmonary/Chest: Effort normal and breath sounds normal. No respiratory distress. She has no wheezes. She has no rales.  Respirations even and unlabored. Lungs clear.  Musculoskeletal: Normal range of motion.  Neurological: She is alert and oriented to person, place, and time. She exhibits normal muscle tone. Coordination normal.  Skin: Skin is warm and dry. No rash noted. She is not diaphoretic. No erythema. No pallor.  Psychiatric: Her behavior is normal. Her mood appears anxious. Her speech is rapid and/or pressured.  Nursing note and vitals reviewed.   ED Course  Procedures (including critical care time) Labs Review Labs Reviewed - No data to display  Imaging Review No results found.   EKG Interpretation None      MDM   Final diagnoses:  Acute right otitis media, recurrence not specified, unspecified otitis media type    Patient presents with otalgia and exam consistent with acute otitis media; likely secondary to preceding URI symptoms. No concern for acute mastoiditis, meningitis. No antibiotic use in the last month. Patient discharged home with Augmentin. Advised parents to call pediatrician today for follow-up. I have also discussed reasons to return immediately to the ER. Patient expresses understanding and agrees with plan.   Filed Vitals:   11/28/14 0452  BP: 132/91  Pulse: 73   Temp: 97.8 F (36.6 C)  TempSrc: Oral  Resp: 20  SpO2: 100%          Antony Madura, PA-C 11/28/14 1610  Ward Givens, MD 11/28/14 [id number]

## 2015-01-04 ENCOUNTER — Other Ambulatory Visit: Payer: Self-pay

## 2015-01-04 DIAGNOSIS — Z3009 Encounter for other general counseling and advice on contraception: Secondary | ICD-10-CM

## 2015-01-04 MED ORDER — MEDROXYPROGESTERONE ACETATE 150 MG/ML IM SUSP: 150.0000 mg | Freq: Once | INTRAMUSCULAR | Status: DC

## 2015-01-15 ENCOUNTER — Encounter: Payer: Self-pay | Admitting: Internal Medicine

## 2015-01-15 ENCOUNTER — Other Ambulatory Visit: Payer: Self-pay

## 2015-01-16 ENCOUNTER — Other Ambulatory Visit (INDEPENDENT_AMBULATORY_CARE_PROVIDER_SITE_OTHER): Payer: BLUE CROSS/BLUE SHIELD

## 2015-01-16 DIAGNOSIS — Z3042 Encounter for surveillance of injectable contraceptive: Secondary | ICD-10-CM | POA: Diagnosis not present

## 2015-01-16 MED ORDER — MEDROXYPROGESTERONE ACETATE 150 MG/ML IM SUSP
150.0000 mg | Freq: Once | INTRAMUSCULAR | Status: AC
  Administered 2015-01-16: 150 mg via INTRAMUSCULAR

## 2015-02-19 ENCOUNTER — Other Ambulatory Visit: Payer: Self-pay

## 2015-02-20 ENCOUNTER — Other Ambulatory Visit (HOSPITAL_COMMUNITY)
Admission: RE | Admit: 2015-02-20 | Discharge: 2015-02-20 | Disposition: A | Discharge status: Home / Self Care | Payer: BLUE CROSS/BLUE SHIELD | Source: Ambulatory Visit | Attending: Gynecology | Admitting: Gynecology

## 2015-02-20 ENCOUNTER — Ambulatory Visit (INDEPENDENT_AMBULATORY_CARE_PROVIDER_SITE_OTHER): Payer: BLUE CROSS/BLUE SHIELD | Admitting: Women's Health

## 2015-02-20 ENCOUNTER — Encounter: Payer: Self-pay | Admitting: Women's Health

## 2015-02-20 VITALS — BP 124/80 | Ht 65.0 in | Wt 121.0 lb

## 2015-02-20 DIAGNOSIS — Z01419 Encounter for gynecological examination (general) (routine) without abnormal findings: Secondary | ICD-10-CM

## 2015-02-20 DIAGNOSIS — B373 Candidiasis of vulva and vagina: Secondary | ICD-10-CM

## 2015-02-20 DIAGNOSIS — A499 Bacterial infection, unspecified: Secondary | ICD-10-CM | POA: Diagnosis not present

## 2015-02-20 DIAGNOSIS — N760 Acute vaginitis: Secondary | ICD-10-CM

## 2015-02-20 DIAGNOSIS — Z3009 Encounter for other general counseling and advice on contraception: Secondary | ICD-10-CM

## 2015-02-20 DIAGNOSIS — B3731 Acute candidiasis of vulva and vagina: Secondary | ICD-10-CM

## 2015-02-20 DIAGNOSIS — B9689 Other specified bacterial agents as the cause of diseases classified elsewhere: Secondary | ICD-10-CM

## 2015-02-20 LAB — CBC WITH DIFFERENTIAL/PLATELET
BASOS ABS: 0 10*3/uL (ref 0.0–0.1)
Basophils Relative: 0 % (ref 0–1)
Eosinophils Absolute: 0.2 10*3/uL (ref 0.0–0.7)
Eosinophils Relative: 2 % (ref 0–5)
HCT: 40.3 % (ref 36.0–46.0)
Hemoglobin: 13.8 g/dL (ref 12.0–15.0)
LYMPHS PCT: 30 % (ref 12–46)
Lymphs Abs: 2.3 10*3/uL (ref 0.7–4.0)
MCH: 29.3 pg (ref 26.0–34.0)
MCHC: 34.2 g/dL (ref 30.0–36.0)
MCV: 85.6 fL (ref 78.0–100.0)
MPV: 11.1 fL (ref 8.6–12.4)
Monocytes Absolute: 0.6 10*3/uL (ref 0.1–1.0)
Monocytes Relative: 8 % (ref 3–12)
NEUTROS ABS: 4.6 10*3/uL (ref 1.7–7.7)
NEUTROS PCT: 60 % (ref 43–77)
PLATELETS: 206 10*3/uL (ref 150–400)
RBC: 4.71 MIL/uL (ref 3.87–5.11)
RDW: 13.4 % (ref 11.5–15.5)
WBC: 7.7 10*3/uL (ref 4.0–10.5)

## 2015-02-20 LAB — WET PREP FOR TRICH, YEAST, CLUE: Trich, Wet Prep: NONE SEEN

## 2015-02-20 MED ORDER — FLUCONAZOLE 150 MG PO TABS: 150.0000 mg | ORAL_TABLET | Freq: Once | ORAL | Status: DC

## 2015-02-20 MED ORDER — MEDROXYPROGESTERONE ACETATE 150 MG/ML IM SUSP: 150.0000 mg | Freq: Once | INTRAMUSCULAR | Status: DC

## 2015-02-20 MED ORDER — METRONIDAZOLE 0.75 % VA GEL: VAGINAL | Status: DC

## 2015-02-20 NOTE — Progress Notes (Signed)
Kellie [name] 1990-12-19 [id number]    History:    Presents for annual exam.  Amenorrhea Depo-Provera. Not sexually active, negative STD screen with past partner. Gardasil series completed.  Past medical history, past surgical history, family history and social history were all reviewed and documented in the EPIC chart. 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   ROS:  A ROS was performed and pertinent positives and negatives are included.  Exam:  Filed Vitals:   02/20/15 1451  BP: 124/80    General appearance:  Normal Thyroid:  Symmetrical, normal in size, without palpable masses or nodularity. Respiratory  Auscultation:  Clear without wheezing or rhonchi Cardiovascular  Auscultation:  Regular rate, without rubs, murmurs or gallops  Edema/varicosities:  Not grossly evident Abdominal  Soft,nontender, without masses, guarding or rebound.  Liver/spleen:  No organomegaly noted  Hernia:  None appreciated  Skin  Inspection:  Grossly normal   Breasts: Examined lying and sitting.     Right: Without masses, retractions, discharge or axillary adenopathy.     Left: Without masses, retractions, discharge or axillary adenopathy. Gentitourinary   Inguinal/mons:  Normal without inguinal adenopathy  External genitalia:  Normal  BUS/Urethra/Skene's glands:  Normal  Vagina:  Moderate white adherent discharge, wet prep positive for yeast, amines, clues, TNTC bacteria  Cervix:  Normal  Uterus:   normal in size, shape and contour.  Midline and mobile  Adnexa/parametria:     Rt: Without masses or tenderness.   Lt: Without masses or tenderness.  Anus and perineum: Normal   Assessment/Plan:  24 y.o. S WF G0 for annual exam with complaint of vaginal odor.   Amenorrheic on Depo-Provera Bacteria vaginosis and yeast  Plan: MetroGel vaginal cream 1 applicator at bedtime 5, alcohol precautions reviewed, Diflucan 150 by mouth 1 dose, call if no  relief of discharge. Condoms encouraged if sexually active. Depo-Provera 150 every 12 weeks IM prescription, proper use given and reviewed, has injections given here. Denies need for STD screen. SBE's, regular exercise, calcium rich diet, MVI daily encouraged. Safety reviewed. CBC, UA, Pap. Pap screening guidelines reviewed.    Harrington ChallengerYOUNG,NANCY J La Porte HospitalWHNP, 3:39 PM 02/20/2015

## 2015-02-20 NOTE — Addendum Note (Signed)
Addended by: Kem ParkinsonBARNES, Mae Cianci on: 02/20/2015 03:45 PM   Modules accepted: Orders

## 2015-02-20 NOTE — Patient Instructions (Signed)

## 2015-02-21 LAB — URINALYSIS, ROUTINE W REFLEX MICROSCOPIC
Bilirubin Urine: NEGATIVE
Glucose, UA: NEGATIVE mg/dL
HGB URINE DIPSTICK: NEGATIVE
KETONES UR: NEGATIVE mg/dL
LEUKOCYTES UA: NEGATIVE
NITRITE: NEGATIVE
PH: 6 (ref 5.0–8.0)
PROTEIN: NEGATIVE mg/dL
Specific Gravity, Urine: 1.027 (ref 1.005–1.030)
UROBILINOGEN UA: 0.2 mg/dL (ref 0.0–1.0)

## 2015-02-22 LAB — CYTOLOGY - PAP

## 2015-03-01 ENCOUNTER — Encounter: Payer: Self-pay | Admitting: Internal Medicine

## 2015-03-01 ENCOUNTER — Ambulatory Visit (INDEPENDENT_AMBULATORY_CARE_PROVIDER_SITE_OTHER): Payer: BLUE CROSS/BLUE SHIELD | Admitting: Internal Medicine

## 2015-03-01 VITALS — BP 110/74 | HR 72 | Ht 64.25 in | Wt 119.2 lb

## 2015-03-01 DIAGNOSIS — R634 Abnormal weight loss: Secondary | ICD-10-CM | POA: Diagnosis not present

## 2015-03-01 DIAGNOSIS — K589 Irritable bowel syndrome without diarrhea: Secondary | ICD-10-CM

## 2015-03-01 DIAGNOSIS — K219 Gastro-esophageal reflux disease without esophagitis: Secondary | ICD-10-CM

## 2015-03-01 NOTE — Progress Notes (Signed)
HISTORY OF PRESENT ILLNESS:  Kellie [name] is a 24 y.o. female who self-referred regarding chronic problems with nausea and vomiting. She reports long-standing problems with pyrosis, water brash, and regurgitation. As well episodes of vomiting which would occur every 3 weeks or so. She was seen by Kellie RowanNancy Young, Kellie [name] last year and placed on omeprazole 40 mg daily. Since that time her symptoms have been significant better. She will have breakthrough symptoms if she misses a dose or 2 of omeprazole. She culture problem "weird" and wants to know "what's going on".. She denies dysphagia. Her only other complaint is chronic stable alternating bowel habits with tendency toward loose stools. She has had 15 pound weight loss recently, but she treats this to anorexia associated with social stressors including marital strife.  REVIEW OF SYSTEMS:  All non-GI ROS negative except for sinus and allergy, anxiety, back pain, cough, fatigue, headaches, muscle cramps, ankle edema, increased thirst  History reviewed. No pertinent past medical history.  Past Surgical History  Procedure Laterality Date  . Bunionectomy Bilateral     Social History Kellie AskewMiranda C Withem  reports that she has never smoked. She has never used smokeless tobacco. She reports that she drinks alcohol. She reports that she does not use illicit drugs.  family history includes Breast cancer in her paternal aunt; Colon cancer in her maternal grandmother; Diabetes in her maternal grandmother, paternal aunt, paternal grandfather, paternal grandmother, and paternal uncle; Heart disease in her maternal grandfather, maternal grandmother, paternal grandfather, and paternal grandmother; Ovarian cancer in her maternal aunt.  No Known Allergies     PHYSICAL EXAMINATION: Vital signs: BP 110/74 mmHg  Pulse 72  Ht 5' 4.25" (1.632 m)  Wt 119 lb 4 oz (54.091 kg)  BMI 20.31 kg/m2 General: Well-developed, well-nourished, no acute distress HEENT: Sclerae  are anicteric, conjunctiva pink. Oral mucosa intact Lungs: Clear Heart: Regular Abdomen: soft, nontender, nondistended, no obvious ascites, no peritoneal signs, normal bowel sounds. No organomegaly. Extremities: No edema Psychiatric: alert and oriented x3. Cooperative   ASSESSMENT:  #1. GERD without alarm features #2. Alternating bowel habits consistent with IBS #3. Weight loss secondary to stress-related anorexia   PLAN:  #1. Discussion on GERD. Reviewed the pathophysiology, treatment, treatment options, and outcomes #2. Continue omeprazole 40 mg daily #3. Reflux precautions #4. Literature on reflux provided #5. Increase dietary fiber for ulcer in bowel habits #6. GI follow-up as needed   30 minutes spent face-to-face with the patient. Greater than 50% of the time use for counseling regarding her condition as stated above

## 2015-03-01 NOTE — Patient Instructions (Signed)
Please follow up as needed 

## 2015-04-18 ENCOUNTER — Ambulatory Visit (INDEPENDENT_AMBULATORY_CARE_PROVIDER_SITE_OTHER): Payer: BLUE CROSS/BLUE SHIELD | Admitting: Gynecology

## 2015-04-18 DIAGNOSIS — Z3042 Encounter for surveillance of injectable contraceptive: Secondary | ICD-10-CM | POA: Diagnosis not present

## 2015-04-18 MED ORDER — MEDROXYPROGESTERONE ACETATE 150 MG/ML IM SUSP
150.0000 mg | Freq: Once | INTRAMUSCULAR | Status: AC
  Administered 2015-04-18: 150 mg via INTRAMUSCULAR

## 2015-06-06 ENCOUNTER — Ambulatory Visit: Payer: BLUE CROSS/BLUE SHIELD

## 2015-06-11 ENCOUNTER — Ambulatory Visit: Payer: BLUE CROSS/BLUE SHIELD | Admitting: Podiatry

## 2015-07-19 ENCOUNTER — Ambulatory Visit: Payer: BLUE CROSS/BLUE SHIELD

## 2015-07-20 ENCOUNTER — Ambulatory Visit (INDEPENDENT_AMBULATORY_CARE_PROVIDER_SITE_OTHER): Payer: BLUE CROSS/BLUE SHIELD | Admitting: *Deleted

## 2015-07-20 DIAGNOSIS — Z3042 Encounter for surveillance of injectable contraceptive: Secondary | ICD-10-CM | POA: Diagnosis not present

## 2015-07-20 MED ORDER — MEDROXYPROGESTERONE ACETATE 150 MG/ML IM SUSP
150.0000 mg | Freq: Once | INTRAMUSCULAR | Status: AC
  Administered 2015-07-20: 150 mg via INTRAMUSCULAR

## 2015-10-24 ENCOUNTER — Telehealth: Payer: Self-pay | Admitting: *Deleted

## 2015-10-24 ENCOUNTER — Ambulatory Visit (INDEPENDENT_AMBULATORY_CARE_PROVIDER_SITE_OTHER): Payer: BLUE CROSS/BLUE SHIELD | Admitting: *Deleted

## 2015-10-24 ENCOUNTER — Other Ambulatory Visit: Payer: BLUE CROSS/BLUE SHIELD

## 2015-10-24 DIAGNOSIS — N912 Amenorrhea, unspecified: Secondary | ICD-10-CM

## 2015-10-24 DIAGNOSIS — Z3042 Encounter for surveillance of injectable contraceptive: Secondary | ICD-10-CM

## 2015-10-24 LAB — PREGNANCY, URINE: Preg Test, Ur: NEGATIVE

## 2015-10-24 MED ORDER — MEDROXYPROGESTERONE ACETATE 150 MG/ML IM SUSP
150.0000 mg | Freq: Once | INTRAMUSCULAR | Status: AC
  Administered 2015-10-24: 150 mg via INTRAMUSCULAR

## 2015-10-24 NOTE — Telephone Encounter (Signed)
Pt aware order placed

## 2015-10-24 NOTE — Telephone Encounter (Signed)
Pt scheduled for depo injection today was due to come back on Dec 9. And had to cancel appointment. Okay to come today at 4:30pm for injection?

## 2015-10-24 NOTE — Telephone Encounter (Signed)
Has she been sexually active since December 9? If so check UPT prior to Depo-Provera

## 2016-07-23 ENCOUNTER — Other Ambulatory Visit: Payer: Self-pay | Admitting: Family Medicine

## 2016-07-23 DIAGNOSIS — R17 Unspecified jaundice: Secondary | ICD-10-CM

## 2016-07-23 DIAGNOSIS — R748 Abnormal levels of other serum enzymes: Secondary | ICD-10-CM

## 2016-08-04 ENCOUNTER — Ambulatory Visit
Admission: RE | Admit: 2016-08-04 | Discharge: 2016-08-04 | Disposition: A | Discharge status: Home / Self Care | Payer: 59 | Source: Ambulatory Visit | Attending: Family Medicine | Admitting: Family Medicine

## 2016-08-04 DIAGNOSIS — R748 Abnormal levels of other serum enzymes: Secondary | ICD-10-CM

## 2016-08-04 DIAGNOSIS — R17 Unspecified jaundice: Secondary | ICD-10-CM

## 2016-10-16 ENCOUNTER — Other Ambulatory Visit: Payer: Self-pay | Admitting: Women's Health

## 2016-10-16 ENCOUNTER — Telehealth: Payer: Self-pay | Admitting: *Deleted

## 2016-10-16 DIAGNOSIS — Z3009 Encounter for other general counseling and advice on contraception: Secondary | ICD-10-CM

## 2016-10-16 MED ORDER — MEDROXYPROGESTERONE ACETATE 150 MG/ML IM SUSP: 150.0000 mg | Freq: Once | INTRAMUSCULAR | 0 refills | Status: DC

## 2016-10-16 NOTE — Telephone Encounter (Signed)
Pt called requesting depo injection sent to pharmacy, pt has annual scheduled on 11/06/16, was due in April 2016. Pt said [facility] due to cost, now wants to come back here, pt said she is due for injection by dec. 11th.  Are you okay with refill being sent?  Please advise

## 2016-10-16 NOTE — Telephone Encounter (Signed)
Coming at 2pm on 10/17/16

## 2016-10-16 NOTE — Telephone Encounter (Signed)
Ok for  1 refill, needs to keep appt.

## 2016-10-16 NOTE — Telephone Encounter (Signed)
Pt informed with the below note, Rx sent. Pt coming at 2:00pm

## 2016-10-17 ENCOUNTER — Ambulatory Visit (INDEPENDENT_AMBULATORY_CARE_PROVIDER_SITE_OTHER): Payer: 59 | Admitting: *Deleted

## 2016-10-17 DIAGNOSIS — N912 Amenorrhea, unspecified: Secondary | ICD-10-CM

## 2016-10-17 DIAGNOSIS — Z3042 Encounter for surveillance of injectable contraceptive: Secondary | ICD-10-CM

## 2016-10-17 LAB — PREGNANCY, URINE: Preg Test, Ur: NEGATIVE

## 2016-10-17 MED ORDER — MEDROXYPROGESTERONE ACETATE 150 MG/ML IM SUSP
150.0000 mg | Freq: Once | INTRAMUSCULAR | Status: AC
  Administered 2016-10-17: 150 mg via INTRAMUSCULAR

## 2016-10-17 NOTE — Addendum Note (Signed)
Addended by: Kem ParkinsonBARNES, Leylani Duley on: 10/17/2016 02:51 PM   Modules accepted: Orders

## 2016-11-06 ENCOUNTER — Encounter: Payer: Self-pay | Admitting: Women's Health

## 2016-11-06 DIAGNOSIS — Z0289 Encounter for other administrative examinations: Secondary | ICD-10-CM

## 2017-01-16 ENCOUNTER — Ambulatory Visit (INDEPENDENT_AMBULATORY_CARE_PROVIDER_SITE_OTHER): Payer: 59 | Admitting: Women's Health

## 2017-01-16 ENCOUNTER — Encounter: Payer: Self-pay | Admitting: Women's Health

## 2017-01-16 VITALS — BP 118/76 | Ht 64.75 in | Wt 115.0 lb

## 2017-01-16 DIAGNOSIS — N898 Other specified noninflammatory disorders of vagina: Secondary | ICD-10-CM | POA: Diagnosis not present

## 2017-01-16 DIAGNOSIS — Z113 Encounter for screening for infections with a predominantly sexual mode of transmission: Secondary | ICD-10-CM

## 2017-01-16 DIAGNOSIS — Z01419 Encounter for gynecological examination (general) (routine) without abnormal findings: Secondary | ICD-10-CM | POA: Diagnosis not present

## 2017-01-16 DIAGNOSIS — B9689 Other specified bacterial agents as the cause of diseases classified elsewhere: Secondary | ICD-10-CM | POA: Diagnosis not present

## 2017-01-16 DIAGNOSIS — Z3042 Encounter for surveillance of injectable contraceptive: Secondary | ICD-10-CM | POA: Diagnosis not present

## 2017-01-16 DIAGNOSIS — N760 Acute vaginitis: Secondary | ICD-10-CM

## 2017-01-16 DIAGNOSIS — Z3009 Encounter for other general counseling and advice on contraception: Secondary | ICD-10-CM | POA: Diagnosis not present

## 2017-01-16 LAB — CBC WITH DIFFERENTIAL/PLATELET
BASOS ABS: 0 {cells}/uL (ref 0–200)
BASOS PCT: 0 %
EOS ABS: 73 {cells}/uL (ref 15–500)
Eosinophils Relative: 1 %
HCT: 42.4 % (ref 35.0–45.0)
Hemoglobin: 14 g/dL (ref 11.7–15.5)
LYMPHS PCT: 26 %
Lymphs Abs: 1898 cells/uL (ref 850–3900)
MCH: 30.2 pg (ref 27.0–33.0)
MCHC: 33 g/dL (ref 32.0–36.0)
MCV: 91.6 fL (ref 80.0–100.0)
MONO ABS: 511 {cells}/uL (ref 200–950)
MONOS PCT: 7 %
MPV: 10.8 fL (ref 7.5–12.5)
NEUTROS PCT: 66 %
Neutro Abs: 4818 cells/uL (ref 1500–7800)
PLATELETS: 225 10*3/uL (ref 140–400)
RBC: 4.63 MIL/uL (ref 3.80–5.10)
RDW: 13.5 % (ref 11.0–15.0)
WBC: 7.3 10*3/uL (ref 3.8–10.8)

## 2017-01-16 LAB — WET PREP FOR TRICH, YEAST, CLUE
TRICH WET PREP: NONE SEEN
Yeast Wet Prep HPF POC: NONE SEEN

## 2017-01-16 LAB — HIV ANTIBODY (ROUTINE TESTING W REFLEX): HIV: NONREACTIVE

## 2017-01-16 MED ORDER — METRONIDAZOLE 500 MG PO TABS: 500.0000 mg | ORAL_TABLET | Freq: Two times a day (BID) | ORAL | 0 refills | Status: DC

## 2017-01-16 MED ORDER — MEDROXYPROGESTERONE ACETATE 150 MG/ML IM SUSP: 150.0000 mg | Freq: Once | INTRAMUSCULAR | 4 refills | Status: DC

## 2017-01-16 MED ORDER — MEDROXYPROGESTERONE ACETATE 150 MG/ML IM SUSP
150.0000 mg | Freq: Once | INTRAMUSCULAR | Status: AC
  Administered 2017-01-16: 150 mg via INTRAMUSCULAR

## 2017-01-16 NOTE — Patient Instructions (Signed)
Health Maintenance, Female Adopting a healthy lifestyle and getting preventive care can go a long way to promote health and wellness. Talk with your health care provider about what schedule of regular examinations is right for you. This is a good chance for you to check in with your provider about disease prevention and staying healthy. In between checkups, there are plenty of things you can do on your own. Experts have done a lot of research about which lifestyle changes and preventive measures are most likely to keep you healthy. Ask your health care provider for more information. Weight and diet Eat a healthy diet  Be sure to include plenty of vegetables, fruits, low-fat dairy products, and lean protein.  Do not eat a lot of foods high in solid fats, added sugars, or salt.  Get regular exercise. This is one of the most important things you can do for your health.  Most adults should exercise for at least 150 minutes each week. The exercise should increase your heart rate and make you sweat (moderate-intensity exercise).  Most adults should also do strengthening exercises at least twice a week. This is in addition to the moderate-intensity exercise. Maintain a healthy weight  Body mass index (BMI) is a measurement that can be used to identify possible weight problems. It estimates body fat based on height and weight. Your health care provider can help determine your BMI and help you achieve or maintain a healthy weight.  For females 76 years of age and older:  A BMI below 18.5 is considered underweight.  A BMI of 18.5 to 24.9 is normal.  A BMI of 25 to 29.9 is considered overweight.  A BMI of 30 and above is considered obese. Watch levels of cholesterol and blood lipids  You should start having your blood tested for lipids and cholesterol at 24 years of age, then have this test every 5 years.  You may need to have your cholesterol levels checked more often if:  Your lipid or  cholesterol levels are high.  You are older than 24 years of age.  You are at high risk for heart disease. Cancer screening Lung Cancer  Lung cancer screening is recommended for adults 64-42 years old who are at high risk for lung cancer because of a history of smoking.  A yearly low-dose CT scan of the lungs is recommended for people who:  Currently smoke.  Have quit within the past 15 years.  Have at least a 30-pack-year history of smoking. A pack year is smoking an average of one pack of cigarettes a day for 1 year.  Yearly screening should continue until it has been 15 years since you quit.  Yearly screening should stop if you develop a health problem that would prevent you from having lung cancer treatment. Breast Cancer  Practice breast self-awareness. This means understanding how your breasts normally appear and feel.  It also means doing regular breast self-exams. Let your health care provider know about any changes, no matter how small.  If you are in your 20s or 30s, you should have a clinical breast exam (CBE) by a health care provider every 1-3 years as part of a regular health exam.  If you are 34 or older, have a CBE every year. Also consider having a breast X-ray (mammogram) every year.  If you have a family history of breast cancer, talk to your health care provider about genetic screening.  If you are at high risk for breast cancer, talk  to your health care provider about having an MRI and a mammogram every year.  Breast cancer gene (BRCA) assessment is recommended for women who have family members with BRCA-related cancers. BRCA-related cancers include:  Breast.  Ovarian.  Tubal.  Peritoneal cancers.  Results of the assessment will determine the need for genetic counseling and BRCA1 and BRCA2 testing. Cervical Cancer  Your health care provider may recommend that you be screened regularly for cancer of the pelvic organs (ovaries, uterus, and vagina).  This screening involves a pelvic examination, including checking for microscopic changes to the surface of your cervix (Pap test). You may be encouraged to have this screening done every 3 years, beginning at age 24.  For women ages 66-65, health care providers may recommend pelvic exams and Pap testing every 3 years, or they may recommend the Pap and pelvic exam, combined with testing for human papilloma virus (HPV), every 5 years. Some types of HPV increase your risk of cervical cancer. Testing for HPV may also be done on women of any age with unclear Pap test results.  Other health care providers may not recommend any screening for nonpregnant women who are considered low risk for pelvic cancer and who do not have symptoms. Ask your health care provider if a screening pelvic exam is right for you.  If you have had past treatment for cervical cancer or a condition that could lead to cancer, you need Pap tests and screening for cancer for at least 20 years after your treatment. If Pap tests have been discontinued, your risk factors (such as having a new sexual partner) need to be reassessed to determine if screening should resume. Some women have medical problems that increase the chance of getting cervical cancer. In these cases, your health care provider may recommend more frequent screening and Pap tests. Colorectal Cancer  This type of cancer can be detected and often prevented.  Routine colorectal cancer screening usually begins at 24 years of age and continues through 24 years of age.  Your health care provider may recommend screening at an earlier age if you have risk factors for colon cancer.  Your health care provider may also recommend using home test kits to check for hidden blood in the stool.  A small camera at the end of a tube can be used to examine your colon directly (sigmoidoscopy or colonoscopy). This is done to check for the earliest forms of colorectal cancer.  Routine  screening usually begins at age 41.  Direct examination of the colon should be repeated every 5-10 years through 24 years of age. However, you may need to be screened more often if early forms of precancerous polyps or small growths are found. Skin Cancer  Check your skin from head to toe regularly.  Tell your health care provider about any new moles or changes in moles, especially if there is a change in a mole's shape or color.  Also tell your health care provider if you have a mole that is larger than the size of a pencil eraser.  Always use sunscreen. Apply sunscreen liberally and repeatedly throughout the day.  Protect yourself by wearing long sleeves, pants, a wide-brimmed hat, and sunglasses whenever you are outside. Heart disease, diabetes, and high blood pressure  High blood pressure causes heart disease and increases the risk of stroke. High blood pressure is more likely to develop in:  People who have blood pressure in the high end of the normal range (130-139/85-89 mm Hg).  People who are overweight or obese.  People who are African American.  If you are 59-24 years of age, have your blood pressure checked every 3-5 years. If you are 34 years of age or older, have your blood pressure checked every year. You should have your blood pressure measured twice-once when you are at a hospital or clinic, and once when you are not at a hospital or clinic. Record the average of the two measurements. To check your blood pressure when you are not at a hospital or clinic, you can use:  An automated blood pressure machine at a pharmacy.  A home blood pressure monitor.  If you are between 29 years and 60 years old, ask your health care provider if you should take aspirin to prevent strokes.  Have regular diabetes screenings. This involves taking a blood sample to check your fasting blood sugar level.  If you are at a normal weight and have a low risk for diabetes, have this test once  every three years after 24 years of age.  If you are overweight and have a high risk for diabetes, consider being tested at a younger age or more often. Preventing infection Hepatitis B  If you have a higher risk for hepatitis B, you should be screened for this virus. You are considered at high risk for hepatitis B if:  You were born in a country where hepatitis B is common. Ask your health care provider which countries are considered high risk.  Your parents were born in a high-risk country, and you have not been immunized against hepatitis B (hepatitis B vaccine).  You have HIV or AIDS.  You use needles to inject street drugs.  You live with someone who has hepatitis B.  You have had sex with someone who has hepatitis B.  You get hemodialysis treatment.  You take certain medicines for conditions, including cancer, organ transplantation, and autoimmune conditions. Hepatitis C  Blood testing is recommended for:  Everyone born from [birth year] through 1963.  Anyone with known risk factors for hepatitis C. Sexually transmitted infections (STIs)  You should be screened for sexually transmitted infections (STIs) including gonorrhea and chlamydia if:  You are sexually active and are younger than 24 years of age.  You are older than 24 years of age and your health care provider tells you that you are at risk for this type of infection.  Your sexual activity has changed since you were last screened and you are at an increased risk for chlamydia or gonorrhea. Ask your health care provider if you are at risk.  If you do not have HIV, but are at risk, it may be recommended that you take a prescription medicine daily to prevent HIV infection. This is called pre-exposure prophylaxis (PrEP). You are considered at risk if:  You are sexually active and do not regularly use condoms or know the HIV status of your partner(s).  You take drugs by injection.  You are sexually active with a partner  who has HIV. Talk with your health care provider about whether you are at high risk of being infected with HIV. If you choose to begin PrEP, you should first be tested for HIV. You should then be tested every 3 months for as long as you are taking PrEP. Pregnancy  If you are premenopausal and you may become pregnant, ask your health care provider about preconception counseling.  If you may become pregnant, take 400 to 800 micrograms (mcg) of folic acid  every day.  If you want to prevent pregnancy, talk to your health care provider about birth control (contraception). Osteoporosis and menopause  Osteoporosis is a disease in which the bones lose minerals and strength with aging. This can result in serious bone fractures. Your risk for osteoporosis can be identified using a bone density scan.  If you are 65 years of age or older, or if you are at risk for osteoporosis and fractures, ask your health care provider if you should be screened.  Ask your health care provider whether you should take a calcium or vitamin D supplement to lower your risk for osteoporosis.  Menopause may have certain physical symptoms and risks.  Hormone replacement therapy may reduce some of these symptoms and risks. Talk to your health care provider about whether hormone replacement therapy is right for you. Follow these instructions at home:  Schedule regular health, dental, and eye exams.  Stay current with your immunizations.  Do not use any tobacco products including cigarettes, chewing tobacco, or electronic cigarettes.  If you are pregnant, do not drink alcohol.  If you are breastfeeding, limit how much and how often you drink alcohol.  Limit alcohol intake to no more than 1 drink per day for nonpregnant women. One drink equals 12 ounces of beer, 5 ounces of wine, or 1 ounces of hard liquor.  Do not use street drugs.  Do not share needles.  Ask your health care provider for help if you need support  or information about quitting drugs.  Tell your health care provider if you often feel depressed.  Tell your health care provider if you have ever been abused or do not feel safe at home. This information is not intended to replace advice given to you by your health care provider. Make sure you discuss any questions you have with your health care provider. Document Released: 05/12/2011 Document Revised: 04/03/2016 Document Reviewed: 07/31/2015 Elsevier Interactive Patient Education  2017 [employer]  Bacterial Vaginosis Bacterial vaginosis is an infection of the vagina. It happens when too many germs (bacteria) grow in the vagina. This infection puts you at risk for infections from sex (STIs). Treating this infection can lower your risk for some STIs. You should also treat this if you are pregnant. It can cause your baby to be born early. Follow these instructions at home: Medicines   Take over-the-counter and prescription medicines only as told by your doctor.  Take or use your antibiotic medicine as told by your doctor. Do not stop taking or using it even if you start to feel better. General instructions   If you your sexual partner is a woman, tell her that you have this infection. She needs to get treatment if she has symptoms. If you have a female partner, he does not need to be treated.  During treatment:  Avoid sex.  Do not douche.  Avoid alcohol as told.  Avoid breastfeeding as told.  Drink enough fluid to keep your pee (urine) clear or pale yellow.  Keep your vagina and butt (rectum) clean.  Wash the area with warm water every day.  Wipe from front to back after you use the toilet.  Keep all follow-up visits as told by your doctor. This is important. Preventing this condition   Do not douche.  Use only warm water to wash around your vagina.  Use protection when you have sex. This includes:  Latex condoms.  Dental dams.  Limit how many people you have sex    It is best to only have sex with the same person (be monogamous).  Get tested for STIs. Have your partner get tested.  Wear underwear that is cotton or lined with cotton.  Avoid tight pants and pantyhose. This is most important in summer.  Do not use any products that have nicotine or tobacco in them. These include cigarettes and e-cigarettes. If you need help quitting, ask your doctor.  Do not use illegal drugs.  Limit how much alcohol you drink. Contact a doctor if:  Your symptoms do not get better, even after you are treated.  You have more discharge or pain when you pee (urinate).  You have a fever.  You have pain in your belly (abdomen).  You have pain with sex.  Your bleed from your vagina between periods. Summary  This infection happens when too many germs (bacteria) grow in the vagina.  Treating this condition can lower your risk for some infections from sex (STIs).  You should also treat this if you are pregnant. It can cause early (premature) birth.  Do not stop taking or using your antibiotic medicine even if you start to feel better. This information is not intended to replace advice given to you by your health care provider. Make sure you discuss any questions you have with your health care provider. Document Released: 08/05/2008 Document Revised: 07/12/2016 Document Reviewed: 07/12/2016 Elsevier Interactive Patient Education  2017 [employer].

## 2017-01-16 NOTE — Progress Notes (Signed)
Kellie AskewMiranda C [name] [birth year] [id number]    History:    Presents for annual exam.  Amenorrheic on Depo-Provera. Normal Pap history. Gardasil series completed. Same partner but had a breakup.  Past medical history, past surgical history, family history and social history were all reviewed and documented in the EPIC chart. Planning to go to cosmetology school. Mother had neck surgery, father hypertension.  ROS:  A ROS was performed and pertinent positives and negatives are included.  Exam:  Vitals:   01/16/17 1413  BP: 118/76  Weight: 115 lb (52.2 kg)  Height: 5' 4.75" (1.645 m)   Body mass index is 19.29 kg/m.   General appearance:  Normal Thyroid:  Symmetrical, normal in size, without palpable masses or nodularity. Respiratory  Auscultation:  Clear without wheezing or rhonchi Cardiovascular  Auscultation:  Regular rate, without rubs, murmurs or gallops  Edema/varicosities:  Not grossly evident Abdominal  Soft,nontender, without masses, guarding or rebound.  Liver/spleen:  No organomegaly noted  Hernia:  None appreciated  Skin  Inspection:  Grossly normal   Breasts: Examined lying and sitting.     Right: Without masses, retractions, discharge or axillary adenopathy.     Left: Without masses, retractions, discharge or axillary adenopathy. Gentitourinary   Inguinal/mons:  Normal without inguinal adenopathy  External genitalia:  Normal  BUS/Urethra/Skene's glands:  Normal  Vagina:  Moderate white frothy discharge with odor, wet prep positive for many clues, TNTC bacteria  Cervix:  Normal  Uterus:   normal in size, shape and contour.  Midline and mobile  Adnexa/parametria:     Rt: Without masses or tenderness.   Lt: Without masses or tenderness.  Anus and perineum: Normal    Assessment/Plan:  26 y.o. S W[place] G0 for annual exam.     Depo-Provera-amenorrhea Bacteria vaginosis STD screen  Plan: Depo-Provera 150 mg IM every 12 weeks, prescription, proper use given and  reviewed importance of calcium rich foods in diet. Condoms encouraged until permanent partner. Flagyl 500 twice daily for 7 days prescription, proper use given and reviewed alcohol precautions. SBE's, regular exercise, calcium rich diet, MVI daily encouraged. Campus safety reviewed. CBC, GC/Chlamydia, HIV, hep B, C, RPRHarrington [name].   YOUNG,NANCY J WHNP, 2:54 PM 01/16/2017

## 2017-01-17 LAB — URINALYSIS W MICROSCOPIC + REFLEX CULTURE
BACTERIA UA: NONE SEEN [HPF]
Bilirubin Urine: NEGATIVE
CASTS: NONE SEEN [LPF]
CRYSTALS: NONE SEEN [HPF]
Glucose, UA: NEGATIVE
HGB URINE DIPSTICK: NEGATIVE
KETONES UR: NEGATIVE
Leukocytes, UA: NEGATIVE
NITRITE: NEGATIVE
PH: 6.5 (ref 5.0–8.0)
PROTEIN: NEGATIVE
RBC / HPF: NONE SEEN RBC/HPF (ref <=2)
SQUAMOUS EPITHELIAL / LPF: NONE SEEN [HPF] (ref <=5)
Specific Gravity, Urine: 1.02 (ref 1.001–1.035)
WBC, UA: NONE SEEN WBC/HPF (ref <=5)
YEAST: NONE SEEN [HPF]

## 2017-01-17 LAB — HEPATITIS B SURFACE ANTIGEN: HEP B S AG: NEGATIVE

## 2017-01-17 LAB — HEPATITIS C ANTIBODY: HCV AB: NEGATIVE

## 2017-01-17 LAB — RPR

## 2017-01-19 LAB — GC/CHLAMYDIA PROBE AMP
CT PROBE, AMP APTIMA: NOT DETECTED
GC PROBE AMP APTIMA: NOT DETECTED

## 2017-04-03 ENCOUNTER — Ambulatory Visit (INDEPENDENT_AMBULATORY_CARE_PROVIDER_SITE_OTHER): Payer: 59 | Admitting: Anesthesiology

## 2017-04-03 DIAGNOSIS — Z3042 Encounter for surveillance of injectable contraceptive: Secondary | ICD-10-CM | POA: Diagnosis not present

## 2017-04-03 DIAGNOSIS — H109 Unspecified conjunctivitis: Secondary | ICD-10-CM | POA: Diagnosis not present

## 2017-04-03 DIAGNOSIS — H01009 Unspecified blepharitis unspecified eye, unspecified eyelid: Secondary | ICD-10-CM | POA: Diagnosis not present

## 2017-04-03 MED ORDER — MEDROXYPROGESTERONE ACETATE 150 MG/ML IM SUSP
150.0000 mg | Freq: Once | INTRAMUSCULAR | Status: AC
  Administered 2017-04-03: 150 mg via INTRAMUSCULAR

## 2017-04-22 DIAGNOSIS — S20222A Contusion of left back wall of thorax, initial encounter: Secondary | ICD-10-CM | POA: Diagnosis not present

## 2017-04-22 DIAGNOSIS — S60222A Contusion of left hand, initial encounter: Secondary | ICD-10-CM | POA: Diagnosis not present

## 2017-05-25 DIAGNOSIS — R454 Irritability and anger: Secondary | ICD-10-CM | POA: Diagnosis not present

## 2017-05-28 DIAGNOSIS — H0011 Chalazion right upper eyelid: Secondary | ICD-10-CM | POA: Diagnosis not present

## 2017-05-28 DIAGNOSIS — H01009 Unspecified blepharitis unspecified eye, unspecified eyelid: Secondary | ICD-10-CM | POA: Diagnosis not present

## 2017-06-19 ENCOUNTER — Ambulatory Visit (INDEPENDENT_AMBULATORY_CARE_PROVIDER_SITE_OTHER): Payer: Self-pay | Admitting: Gynecology

## 2017-06-19 DIAGNOSIS — Z3042 Encounter for surveillance of injectable contraceptive: Secondary | ICD-10-CM

## 2017-06-19 MED ORDER — MEDROXYPROGESTERONE ACETATE 150 MG/ML IM SUSP
150.0000 mg | Freq: Once | INTRAMUSCULAR | Status: AC
Start: 2017-06-19 — End: 2017-06-19
  Administered 2017-06-19: 150 mg via INTRAMUSCULAR

## 2017-06-25 DIAGNOSIS — H0011 Chalazion right upper eyelid: Secondary | ICD-10-CM | POA: Diagnosis not present

## 2017-06-25 DIAGNOSIS — H01009 Unspecified blepharitis unspecified eye, unspecified eyelid: Secondary | ICD-10-CM | POA: Diagnosis not present

## 2017-09-25 ENCOUNTER — Encounter (HOSPITAL_COMMUNITY): Payer: Self-pay | Admitting: Emergency Medicine

## 2017-09-25 ENCOUNTER — Emergency Department (HOSPITAL_COMMUNITY): Payer: No Typology Code available for payment source

## 2017-09-25 ENCOUNTER — Emergency Department (HOSPITAL_COMMUNITY)
Admission: EM | Admit: 2017-09-25 | Discharge: 2017-09-25 | Disposition: A | Discharge status: Home / Self Care | Payer: No Typology Code available for payment source | Attending: Emergency Medicine | Admitting: Emergency Medicine

## 2017-09-25 DIAGNOSIS — Z79899 Other long term (current) drug therapy: Secondary | ICD-10-CM | POA: Diagnosis not present

## 2017-09-25 DIAGNOSIS — R109 Unspecified abdominal pain: Secondary | ICD-10-CM | POA: Insufficient documentation

## 2017-09-25 DIAGNOSIS — M542 Cervicalgia: Secondary | ICD-10-CM | POA: Insufficient documentation

## 2017-09-25 DIAGNOSIS — M549 Dorsalgia, unspecified: Secondary | ICD-10-CM | POA: Diagnosis present

## 2017-09-25 LAB — CBC
HEMATOCRIT: 41.9 % (ref 36.0–46.0)
HEMOGLOBIN: 14.8 g/dL (ref 12.0–15.0)
MCH: 31 pg (ref 26.0–34.0)
MCHC: 35.3 g/dL (ref 30.0–36.0)
MCV: 87.8 fL (ref 78.0–100.0)
Platelets: 277 10*3/uL (ref 150–400)
RBC: 4.77 MIL/uL (ref 3.87–5.11)
RDW: 12.6 % (ref 11.5–15.5)
WBC: 8.1 10*3/uL (ref 4.0–10.5)

## 2017-09-25 LAB — COMPREHENSIVE METABOLIC PANEL
ALK PHOS: 82 U/L (ref 38–126)
ALT: 19 U/L (ref 14–54)
AST: 32 U/L (ref 15–41)
Albumin: 5 g/dL (ref 3.5–5.0)
Anion gap: 12 (ref 5–15)
BILIRUBIN TOTAL: 2.3 mg/dL — AB (ref 0.3–1.2)
BUN: 6 mg/dL (ref 6–20)
CALCIUM: 9.5 mg/dL (ref 8.9–10.3)
CO2: 21 mmol/L — ABNORMAL LOW (ref 22–32)
Chloride: 105 mmol/L (ref 101–111)
Creatinine, Ser: 0.96 mg/dL (ref 0.44–1.00)
GFR calc Af Amer: >60 mL/min (ref >60)
Glucose, Bld: 89 mg/dL (ref 65–99)
POTASSIUM: 2.6 mmol/L — AB (ref 3.5–5.1)
Sodium: 138 mmol/L (ref 135–145)
TOTAL PROTEIN: 7.4 g/dL (ref 6.5–8.1)

## 2017-09-25 LAB — I-STAT BETA HCG BLOOD, ED (MC, WL, AP ONLY): I-stat hCG, quantitative: <5 m[IU]/mL (ref <5)

## 2017-09-25 MED ORDER — MELOXICAM 15 MG PO TABS: 15.0000 mg | ORAL_TABLET | Freq: Every day | ORAL | 0 refills | Status: DC

## 2017-09-25 MED ORDER — BACLOFEN 10 MG PO TABS: 10.0000 mg | ORAL_TABLET | Freq: Three times a day (TID) | ORAL | 0 refills | Status: DC

## 2017-09-25 MED ORDER — IOPAMIDOL (ISOVUE-300) INJECTION 61%
100.0000 mL | Freq: Once | INTRAVENOUS | Status: AC | PRN
  Administered 2017-09-25: 100 mL via INTRAVENOUS

## 2017-09-25 MED ORDER — ONDANSETRON HCL 4 MG/2ML IJ SOLN
4.0000 mg | Freq: Once | INTRAMUSCULAR | Status: AC | PRN
  Administered 2017-09-25: 4 mg via INTRAVENOUS
  Filled 2017-09-25: qty 2

## 2017-09-25 MED ORDER — POTASSIUM CHLORIDE CRYS ER 20 MEQ PO TBCR
40.0000 meq | EXTENDED_RELEASE_TABLET | Freq: Once | ORAL | Status: AC
  Administered 2017-09-25: 40 meq via ORAL
  Filled 2017-09-25: qty 2

## 2017-09-25 NOTE — Discharge Instructions (Signed)
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
## 2017-09-25 NOTE — ED Notes (Signed)
Pt verbalized understanding discharge instructions and denies any further needs or questions at this time. VS stable, ambulatory and steady gait.   
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
## 2019-01-20 DIAGNOSIS — H01009 Unspecified blepharitis unspecified eye, unspecified eyelid: Secondary | ICD-10-CM | POA: Diagnosis not present

## 2019-01-20 DIAGNOSIS — R51 Headache: Secondary | ICD-10-CM | POA: Diagnosis not present

## 2019-01-20 DIAGNOSIS — H5203 Hypermetropia, bilateral: Secondary | ICD-10-CM | POA: Diagnosis not present

## 2019-10-17 ENCOUNTER — Ambulatory Visit
Admission: EM | Admit: 2019-10-17 | Discharge: 2019-10-17 | Disposition: A | Discharge status: Home / Self Care | Payer: 59 | Attending: Physician Assistant | Admitting: Physician Assistant

## 2019-10-17 ENCOUNTER — Other Ambulatory Visit: Payer: Self-pay

## 2019-10-17 DIAGNOSIS — R059 Cough, unspecified: Secondary | ICD-10-CM

## 2019-10-17 DIAGNOSIS — R6883 Chills (without fever): Secondary | ICD-10-CM | POA: Diagnosis not present

## 2019-10-17 DIAGNOSIS — R05 Cough: Secondary | ICD-10-CM | POA: Diagnosis not present

## 2019-10-17 DIAGNOSIS — Z20828 Contact with and (suspected) exposure to other viral communicable diseases: Secondary | ICD-10-CM | POA: Diagnosis not present

## 2019-10-17 DIAGNOSIS — R52 Pain, unspecified: Secondary | ICD-10-CM | POA: Diagnosis not present

## 2019-10-17 DIAGNOSIS — F17200 Nicotine dependence, unspecified, uncomplicated: Secondary | ICD-10-CM

## 2019-10-17 NOTE — Discharge Instructions (Signed)
COVID testing ordered. I would like you to quarantine until testing results. You can take over the counter flonase/nasacort to help with nasal congestion/drainage. If experiencing shortness of breath, trouble breathing, go to the emergency department for further evaluation needed. °

## 2019-10-17 NOTE — ED Provider Notes (Signed)
[facility]    CSN: [id number] Arrival date & time: 10/17/19  1224      History   Chief Complaint Chief Complaint  Patient presents with  . Cough    HPI Kellie [name] is a 28 y.o. female.   28 year old female comes in for 4 day history of URI symptoms. Has had chills, body aches, cough, congestion, diarrhea. Denies fever. Had abdominal pain that is associated with diarrhea, this has since resolved. Denies nausea, vomiting. Denies loss of taste/smell. States for that past 3 days, has had some wheezing, shortness of breath, worse with movement and laying down. States this has mostly improved. Current every day smoker.      History reviewed. No pertinent past medical history.  Patient Active Problem List   Diagnosis Date Noted  . BV (bacterial vaginosis) 01/16/2017    Past Surgical History:  Procedure Laterality Date  . BUNIONECTOMY Bilateral     OB History    Gravida  0   Para      Term      Preterm      AB      Living        SAB      TAB      Ectopic      Multiple      Live Births               Home Medications    Prior to Admission medications   Medication Sig Start Date End Date Taking? Authorizing Provider  baclofen (LIORESAL) 10 MG tablet Take 1 tablet (10 mg total) 3 (three) times daily by mouth. 09/25/17   Arthor Captain, PA-C  ibuprofen (ADVIL,MOTRIN) 200 MG tablet Take 200 mg by mouth every 6 (six) hours as needed. For pain    [provider]  omeprazole (PRILOSEC) 40 MG capsule Take 1 capsule by mouth daily. 11/23/14   [provider]    Family History Family History  Problem Relation Age of Onset  . Ovarian cancer Maternal Aunt   . Diabetes Paternal Aunt   . Breast cancer Paternal Aunt        x 2  . Diabetes Paternal Uncle   . Diabetes Maternal Grandmother   . Colon cancer Maternal Grandmother   . Heart disease Maternal Grandmother   . Heart disease Maternal Grandfather   . Diabetes  Paternal Grandmother   . Heart disease Paternal Grandmother   . Diabetes Paternal Grandfather   . Heart disease Paternal Grandfather     Social History Social History   Tobacco Use  . Smoking status: Current Every Day Smoker  . Smokeless tobacco: Never Used  Substance Use Topics  . Alcohol use: Yes    Alcohol/week: 0.0 standard drinks    Comment: social  . Drug use: No    Comment: past marijuana     Allergies   Patient has no known allergies.   Review of Systems Review of Systems  Reason unable to perform ROS: See HPI as above.     Physical Exam Triage Vital Signs ED Triage Vitals [10/17/19 1238]  Enc Vitals Group     BP 127/77     Pulse Rate 73     Resp 18     Temp 98.7 F (37.1 C)     Temp Source Oral     SpO2 (!) 9 %     Weight      Height      Head Circumference  Peak Flow      Pain Score 0     Pain Loc      Pain Edu?      Excl. in [place]?    No data found.  Updated Vital Signs BP 127/77 (BP Location: Left Arm)   Pulse 73   Temp 98.7 F (37.1 C) (Oral)   Resp 18   LMP 10/16/2019   SpO2 98%   Physical Exam Constitutional:      General: She is not in acute distress.    Appearance: Normal appearance. She is not ill-appearing, toxic-appearing or diaphoretic.  HENT:     Head: Normocephalic and atraumatic.     Mouth/Throat:     Mouth: Mucous membranes are moist.     Pharynx: Oropharynx is clear. Uvula midline.  Neck:     Musculoskeletal: Normal range of motion and neck supple.  Cardiovascular:     Rate and Rhythm: Normal rate and regular rhythm.     Heart sounds: Normal heart sounds. No murmur. No friction rub. No gallop.   Pulmonary:     Effort: Pulmonary effort is normal. No accessory muscle usage, prolonged expiration, respiratory distress or retractions.     Comments: Lungs clear to auscultation without adventitious lung sounds. Neurological:     General: No focal deficit present.     Mental Status: She is alert and oriented to  person, place, and time.      UC Treatments / Results  Labs (all labs ordered are listed, but only abnormal results are displayed) Labs Reviewed  NOVEL CORONAVIRUS, NAA    EKG   Radiology No results found.  Procedures Procedures (including critical care time)  Medications Ordered in UC Medications - No data to display  Initial Impression / Assessment and Plan / UC Course  I have reviewed the triage vital signs and the nursing notes.  Pertinent labs & imaging results that were available during my care of the patient were reviewed by me and considered in my medical decision making (see chart for details).    COVID testing ordered. Patient to quarantine until testing results return. No alarming signs on exam.  Patient speaking in full sentences without respiratory distress.  Symptomatic treatment discussed.  Push fluids.  Return precautions given.  Patient expresses understanding and agrees to plan.  Final Clinical Impressions(s) / UC Diagnoses   Final diagnoses:  Cough   ED Prescriptions    None     PDMP not reviewed this encounter.   Ok Edwards, PA-C 10/17/19 1422

## 2019-10-17 NOTE — ED Triage Notes (Signed)
Pt c/o not feeling good since Friday Chills, bodyaches, cough/congestion, and diarrhea. States feels much better but still has a cough. States needs testing to go back to work.

## 2019-10-19 LAB — NOVEL CORONAVIRUS, NAA: SARS-CoV-2, NAA: NOT DETECTED

## 2020-01-12 ENCOUNTER — Observation Stay (HOSPITAL_COMMUNITY)
Admission: EM | Admit: 2020-01-12 | Discharge: 2020-01-13 | Disposition: A | Discharge status: Home / Self Care | Payer: 59 | Attending: Family Medicine | Admitting: Family Medicine

## 2020-01-12 ENCOUNTER — Other Ambulatory Visit: Payer: Self-pay

## 2020-01-12 ENCOUNTER — Encounter (HOSPITAL_COMMUNITY): Payer: Self-pay

## 2020-01-12 ENCOUNTER — Emergency Department (HOSPITAL_COMMUNITY): Payer: 59

## 2020-01-12 DIAGNOSIS — Z20822 Contact with and (suspected) exposure to covid-19: Secondary | ICD-10-CM | POA: Diagnosis not present

## 2020-01-12 DIAGNOSIS — K219 Gastro-esophageal reflux disease without esophagitis: Secondary | ICD-10-CM | POA: Diagnosis not present

## 2020-01-12 DIAGNOSIS — F17210 Nicotine dependence, cigarettes, uncomplicated: Secondary | ICD-10-CM | POA: Diagnosis not present

## 2020-01-12 DIAGNOSIS — Z720 Tobacco use: Secondary | ICD-10-CM | POA: Diagnosis not present

## 2020-01-12 DIAGNOSIS — F1010 Alcohol abuse, uncomplicated: Secondary | ICD-10-CM | POA: Diagnosis present

## 2020-01-12 DIAGNOSIS — L509 Urticaria, unspecified: Secondary | ICD-10-CM | POA: Insufficient documentation

## 2020-01-12 DIAGNOSIS — N12 Tubulo-interstitial nephritis, not specified as acute or chronic: Secondary | ICD-10-CM | POA: Diagnosis present

## 2020-01-12 DIAGNOSIS — R112 Nausea with vomiting, unspecified: Secondary | ICD-10-CM

## 2020-01-12 HISTORY — DX: Gastro-esophageal reflux disease without esophagitis: K21.9

## 2020-01-12 LAB — COMPREHENSIVE METABOLIC PANEL
ALT: 16 U/L (ref 0–44)
AST: 18 U/L (ref 15–41)
Albumin: 4.3 g/dL (ref 3.5–5.0)
Alkaline Phosphatase: 60 U/L (ref 38–126)
Anion gap: 15 (ref 5–15)
BUN: 8 mg/dL (ref 6–20)
CO2: 22 mmol/L (ref 22–32)
Calcium: 9.2 mg/dL (ref 8.9–10.3)
Chloride: 105 mmol/L (ref 98–111)
Creatinine, Ser: 0.96 mg/dL (ref 0.44–1.00)
GFR calc Af Amer: >60 mL/min (ref >60)
GFR calc non Af Amer: >60 mL/min (ref >60)
Glucose, Bld: 100 mg/dL — ABNORMAL HIGH (ref 70–99)
Potassium: 3.9 mmol/L (ref 3.5–5.1)
Sodium: 142 mmol/L (ref 135–145)
Total Bilirubin: 2 mg/dL — ABNORMAL HIGH (ref 0.3–1.2)
Total Protein: 6.9 g/dL (ref 6.5–8.1)

## 2020-01-12 LAB — URINALYSIS, ROUTINE W REFLEX MICROSCOPIC
Bilirubin Urine: NEGATIVE
Glucose, UA: NEGATIVE mg/dL
Ketones, ur: NEGATIVE mg/dL
Nitrite: POSITIVE — AB
Protein, ur: 30 mg/dL — AB
Specific Gravity, Urine: 1.012 (ref 1.005–1.030)
WBC, UA: >50 WBC/hpf — ABNORMAL HIGH (ref 0–5)
pH: 9 — ABNORMAL HIGH (ref 5.0–8.0)

## 2020-01-12 LAB — I-STAT BETA HCG BLOOD, ED (MC, WL, AP ONLY): I-stat hCG, quantitative: <5 m[IU]/mL (ref <5)

## 2020-01-12 LAB — HIV ANTIBODY (ROUTINE TESTING W REFLEX): HIV Screen 4th Generation wRfx: NONREACTIVE

## 2020-01-12 LAB — CREATININE, SERUM
Creatinine, Ser: 0.86 mg/dL (ref 0.44–1.00)
GFR calc Af Amer: >60 mL/min (ref >60)
GFR calc non Af Amer: >60 mL/min (ref >60)

## 2020-01-12 LAB — CBC
HCT: 43.3 % (ref 36.0–46.0)
HCT: 40.4 % (ref 36.0–46.0)
Hemoglobin: 14.6 g/dL (ref 12.0–15.0)
Hemoglobin: 14 g/dL (ref 12.0–15.0)
MCH: 30.8 pg (ref 26.0–34.0)
MCH: 30.9 pg (ref 26.0–34.0)
MCHC: 33.7 g/dL (ref 30.0–36.0)
MCHC: 34.7 g/dL (ref 30.0–36.0)
MCV: 91.4 fL (ref 80.0–100.0)
MCV: 89.2 fL (ref 80.0–100.0)
Platelets: 239 10*3/uL (ref 150–400)
Platelets: 197 10*3/uL (ref 150–400)
RBC: 4.74 MIL/uL (ref 3.87–5.11)
RBC: 4.53 MIL/uL (ref 3.87–5.11)
RDW: 12.1 % (ref 11.5–15.5)
RDW: 12.1 % (ref 11.5–15.5)
WBC: 14.1 10*3/uL — ABNORMAL HIGH (ref 4.0–10.5)
WBC: 16.2 10*3/uL — ABNORMAL HIGH (ref 4.0–10.5)
nRBC: 0 % (ref 0.0–0.2)
nRBC: 0 % (ref 0.0–0.2)

## 2020-01-12 LAB — SARS CORONAVIRUS 2 (TAT 6-24 HRS): SARS Coronavirus 2: NEGATIVE

## 2020-01-12 LAB — RPR: RPR Ser Ql: NONREACTIVE

## 2020-01-12 LAB — WET PREP, GENITAL
Clue Cells Wet Prep HPF POC: NONE SEEN
Sperm: NONE SEEN
Trich, Wet Prep: NONE SEEN
Yeast Wet Prep HPF POC: NONE SEEN

## 2020-01-12 LAB — LIPASE, BLOOD: Lipase: 34 U/L (ref 11–51)

## 2020-01-12 LAB — LACTIC ACID, PLASMA: Lactic Acid, Venous: 1.3 mmol/L (ref 0.5–1.9)

## 2020-01-12 MED ORDER — ACETAMINOPHEN 650 MG RE SUPP: 650.0000 mg | Freq: Four times a day (QID) | RECTAL | Status: DC | PRN

## 2020-01-12 MED ORDER — ONDANSETRON HCL 4 MG PO TABS: 4.0000 mg | ORAL_TABLET | Freq: Four times a day (QID) | ORAL | Status: DC | PRN

## 2020-01-12 MED ORDER — ONDANSETRON HCL 4 MG/2ML IJ SOLN
4.0000 mg | Freq: Four times a day (QID) | INTRAMUSCULAR | Status: DC | PRN
  Administered 2020-01-12: 4 mg via INTRAVENOUS
  Filled 2020-01-12: qty 2

## 2020-01-12 MED ORDER — SODIUM CHLORIDE 0.9 % IV SOLN
1.0000 g | Freq: Once | INTRAVENOUS | Status: DC
  Filled 2020-01-12: qty 10

## 2020-01-12 MED ORDER — DIPHENHYDRAMINE HCL 50 MG/ML IJ SOLN: 25.0000 mg | Freq: Once | INTRAMUSCULAR | Status: DC

## 2020-01-12 MED ORDER — PROCHLORPERAZINE EDISYLATE 10 MG/2ML IJ SOLN
10.0000 mg | Freq: Once | INTRAMUSCULAR | Status: AC
  Administered 2020-01-12: 10 mg via INTRAVENOUS
  Filled 2020-01-12: qty 2

## 2020-01-12 MED ORDER — DIPHENHYDRAMINE HCL 50 MG/ML IJ SOLN
INTRAMUSCULAR | Status: AC
  Administered 2020-01-12: 50 mg via INTRAVENOUS
  Filled 2020-01-12: qty 1

## 2020-01-12 MED ORDER — ONDANSETRON 4 MG PO TBDP
4.0000 mg | ORAL_TABLET | Freq: Once | ORAL | Status: AC | PRN
  Administered 2020-01-12: 4 mg via ORAL
  Filled 2020-01-12: qty 1

## 2020-01-12 MED ORDER — MORPHINE SULFATE (PF) 4 MG/ML IV SOLN
4.0000 mg | Freq: Once | INTRAVENOUS | Status: AC
  Administered 2020-01-12: 4 mg via INTRAVENOUS
  Filled 2020-01-12: qty 1

## 2020-01-12 MED ORDER — SODIUM CHLORIDE 0.9 % IV SOLN: INTRAVENOUS | Status: DC

## 2020-01-12 MED ORDER — OXYCODONE-ACETAMINOPHEN 5-325 MG PO TABS
1.0000 | ORAL_TABLET | ORAL | Status: AC | PRN
  Administered 2020-01-12 (×2): 1 via ORAL
  Filled 2020-01-12 (×2): qty 1

## 2020-01-12 MED ORDER — ENOXAPARIN SODIUM 40 MG/0.4ML ~~LOC~~ SOLN
40.0000 mg | SUBCUTANEOUS | Status: DC
  Administered 2020-01-12: 40 mg via SUBCUTANEOUS
  Filled 2020-01-12: qty 0.4

## 2020-01-12 MED ORDER — SODIUM CHLORIDE 0.9 % IV SOLN
1.0000 g | Freq: Once | INTRAVENOUS | Status: AC
  Administered 2020-01-12: 1 g via INTRAVENOUS
  Filled 2020-01-12: qty 10

## 2020-01-12 MED ORDER — IOHEXOL 300 MG/ML  SOLN
100.0000 mL | Freq: Once | INTRAMUSCULAR | Status: AC | PRN
  Administered 2020-01-12: 100 mL via INTRAVENOUS

## 2020-01-12 MED ORDER — ONDANSETRON HCL 4 MG/2ML IJ SOLN
4.0000 mg | Freq: Once | INTRAMUSCULAR | Status: AC
  Administered 2020-01-12: 4 mg via INTRAVENOUS
  Filled 2020-01-12: qty 2

## 2020-01-12 MED ORDER — ACETAMINOPHEN 325 MG PO TABS
650.0000 mg | ORAL_TABLET | Freq: Four times a day (QID) | ORAL | Status: DC | PRN
  Administered 2020-01-13: 650 mg via ORAL
  Filled 2020-01-12: qty 2

## 2020-01-12 MED ORDER — PROMETHAZINE HCL 25 MG/ML IJ SOLN
12.5000 mg | Freq: Once | INTRAMUSCULAR | Status: AC
  Administered 2020-01-12: 12.5 mg via INTRAVENOUS
  Filled 2020-01-12: qty 1

## 2020-01-12 NOTE — ED Provider Notes (Addendum)
[facility] EMERGENCY DEPARTMENT Provider Note   CSN: [id number] Arrival date & time: 01/12/20  0756     History Chief Complaint  Patient presents with  . Abdominal Pain  . Hand Pain  . Emesis    Kellie [name] is a 29 y.o. female.  The history is provided by the patient and medical records. No language interpreter was used.  Abdominal Pain Associated symptoms: vomiting   Hand Pain Associated symptoms include abdominal pain.  Emesis Associated symptoms: abdominal pain      29 year old female presenting for evaluation of abdominal pain.  Patient report for the past 2 days she has had waxing waning pain to her right side abdomen.  She described pain as a crampy sharp achy sensation, moderate in severity, worse with positional change along with increased urinary frequency and urgency but without burning urination.  She also report noticed vaginal bleeding.  She endorsed fever and chills.  Pain not improved when she lays on the affected side as well as when she eats.  No loss of appetite.  Denies any new sexual partner.  She denies Raynaud's sneezing or coughing loss of taste or smell.  No active nausea vomiting.  History reviewed. No pertinent past medical history.  Patient Active Problem List   Diagnosis Date Noted  . BV (bacterial vaginosis) 01/16/2017    Past Surgical History:  Procedure Laterality Date  . BUNIONECTOMY Bilateral      OB History    Gravida  0   Para      Term      Preterm      AB      Living        SAB      TAB      Ectopic      Multiple      Live Births              Family History  Problem Relation Age of Onset  . Ovarian cancer Maternal Aunt   . Diabetes Paternal Aunt   . Breast cancer Paternal Aunt        x 2  . Diabetes Paternal Uncle   . Diabetes Maternal Grandmother   . Colon cancer Maternal Grandmother   . Heart disease Maternal Grandmother   . Heart disease Maternal Grandfather   . Diabetes  Paternal Grandmother   . Heart disease Paternal Grandmother   . Diabetes Paternal Grandfather   . Heart disease Paternal Grandfather     Social History   Tobacco Use  . Smoking status: Current Every Day Smoker  . Smokeless tobacco: Never Used  Substance Use Topics  . Alcohol use: Yes    Alcohol/week: 0.0 standard drinks    Comment: social  . Drug use: No    Comment: past marijuana    Home Medications Prior to Admission medications   Medication Sig Start Date End Date Taking? Authorizing Provider  baclofen (LIORESAL) 10 MG tablet Take 1 tablet (10 mg total) 3 (three) times daily by mouth. 09/25/17   Arthor Captain, PA-C  ibuprofen (ADVIL,MOTRIN) 200 MG tablet Take 200 mg by mouth every 6 (six) hours as needed. For pain    [provider]  omeprazole (PRILOSEC) 40 MG capsule Take 1 capsule by mouth daily. 11/23/14   [provider]    Allergies    Patient has no known allergies.  Review of Systems   Review of Systems  Gastrointestinal: Positive for abdominal pain and vomiting.  All  other systems reviewed and are negative.   Physical Exam Updated Vital Signs BP 119/90 (BP Location: Right Arm)   Pulse (!) 109   Temp 100 F (37.8 C) (Oral)   Resp 16   SpO2 98%   Physical Exam Vitals and nursing note reviewed.  Constitutional:      General: She is not in acute distress.    Appearance: She is well-developed.  HENT:     Head: Atraumatic.  Eyes:     Conjunctiva/sclera: Conjunctivae normal.  Cardiovascular:     Rate and Rhythm: Tachycardia present.     Heart sounds: Normal heart sounds.  Pulmonary:     Effort: Pulmonary effort is normal.     Breath sounds: Normal breath sounds. No wheezing, rhonchi or rales.  Abdominal:     General: Abdomen is flat.     Palpations: Abdomen is soft.     Tenderness: There is abdominal tenderness in the suprapubic area. There is right CVA tenderness. There is no left CVA tenderness. Negative signs include Murphy's  sign, Rovsing's sign and McBurney's sign.  Genitourinary:    Comments: Chaperone present during exam.  No inguinal lymphadenopathy or inguinal hernia noted.  Normal external genitalia.  Mild discomfort with speculum insertion.  Moderate amount of vaginal discharge noted in vaginal vault.  Close cervical os free of lesion or rash.  On bimanual examination, no adnexal tenderness or cervical motion tenderness. Musculoskeletal:     Cervical back: Neck supple.  Skin:    Findings: No rash.  Neurological:     Mental Status: She is alert.     ED Results / Procedures / Treatments   Labs (all labs ordered are listed, but only abnormal results are displayed) Labs Reviewed  WET PREP, GENITAL - Abnormal; Notable for the following components:      Result Value   WBC, Wet Prep HPF POC MANY (*)    All other components within normal limits  COMPREHENSIVE METABOLIC PANEL - Abnormal; Notable for the following components:   Glucose, Bld 100 (*)    Total Bilirubin 2.0 (*)    All other components within normal limits  CBC - Abnormal; Notable for the following components:   WBC 14.1 (*)    All other components within normal limits  URINALYSIS, ROUTINE W REFLEX MICROSCOPIC - Abnormal; Notable for the following components:   APPearance HAZY (*)    pH 9.0 (*)    Hgb urine dipstick SMALL (*)    Protein, ur 30 (*)    Nitrite POSITIVE (*)    Leukocytes,Ua MODERATE (*)    WBC, UA >50 (*)    Bacteria, UA MANY (*)    All other components within normal limits  SARS CORONAVIRUS 2 (TAT 6-24 HRS)  URINE CULTURE  CULTURE, BLOOD (ROUTINE X 2)  CULTURE, BLOOD (ROUTINE X 2)  LIPASE, BLOOD  RPR  HIV ANTIBODY (ROUTINE TESTING W REFLEX)  LACTIC ACID, PLASMA  I-STAT BETA HCG BLOOD, ED ([facility], [facility], [facility] ONLY)  GC/CHLAMYDIA PROBE AMP ([facility]) NOT AT [facility]    EKG None  Radiology CT ABDOMEN PELVIS W CONTRAST  Result Date: 01/12/2020 CLINICAL DATA:  29 year old female with abdominal and right flank pain. EXAM: CT  ABDOMEN AND PELVIS WITH CONTRAST TECHNIQUE: Multidetector CT imaging of the abdomen and pelvis was performed using the standard protocol following bolus administration of intravenous contrast. CONTRAST:  OMNIPAQUE IOHEXOL 300 MG/ML  SOLN COMPARISON:  CT chest, abdomen and Pelvis 09/25/2017 FINDINGS: Lower chest: Negative. Hepatobiliary: Negative liver  and gallbladder. No bile duct enlargement. Pancreas: Negative. Spleen: Negative; there are few small chronic calcified splenic granulomas. Adrenals/Urinary Tract: Normal adrenal glands. There is a differential enhancement of the kidneys, and asymmetric right renal pelvis and proximal ureter urothelial thickening (coronal image 60). The right nephrogram appear somewhat delayed, and there is some subtle indistinct hypoenhancement in the right renal upper pole. The right ureter appears to be hyperenhancing but is decompressed in the mid abdomen on series 3, image 42. And no calculus is identified along the course of the ureter. The distal right ureter also appears to be hyperenhancing on series 3, image 71. The contralateral left ureter appears normal. No nephrolithiasis identified. Right perinephric stranding is primarily at the renal hilum. Mildly thick-walled but otherwise unremarkable urinary bladder. Stomach/Bowel: Redundant sigmoid colon with mild retained stool. Mild gas and stool in the descending colon. Negative rectum. Decompressed transverse colon. Mild retained stool in the right colon. The cecum is partially located in the pelvis. A normal appendix is visible along the right internal iliac vascular structures on series 6, image 69. Decompressed terminal ileum. No dilated small bowel. No mesenteric stranding identified. Negative stomach and duodenum. No free air. No abdominal free fluid. Vascular/Lymphatic: Major arterial structures are patent and appear normal. Portal venous system is patent. Reproductive: Negative. Other: Small volume of pelvic free  fluid mostly in the cul-de-sac, probably physiologic (series 3, image 67). Musculoskeletal: Negative. IMPRESSION: 1. Diffusely abnormal right urothelial enhancement compatible with Ascending Urinary Tract Infection. But there is only mildly abnormal enhancement of the right renal parenchyma; mild if any right pyelonephritis. 2. Normal left kidney and ureter. No urinary calculus or obstructive uropathy. 3. Small volume of pelvic free fluid is probably physiologic. 4. Normal appendix.  No bowel inflammation identified. Electronically Signed   By: Odessa Fleming M.D.   On: 01/12/2020 13:18    Procedures Procedures (including critical care time)  Medications Ordered in ED Medications  promethazine (PHENERGAN) injection 12.5 mg (has no administration in time range)  ondansetron (ZOFRAN-ODT) disintegrating tablet 4 mg (4 mg Oral Given 01/12/20 0828)  oxyCODONE-acetaminophen (PERCOCET/ROXICET) 5-325 MG per tablet 1 tablet (1 tablet Oral Given 01/12/20 1204)  cefTRIAXone (ROCEPHIN) 1 g in sodium chloride 0.9 % 100 mL IVPB (0 g Intravenous Stopped 01/12/20 1249)  ondansetron (ZOFRAN) injection 4 mg (4 mg Intravenous Given 01/12/20 1203)  iohexol (OMNIPAQUE) 300 MG/ML solution 100 mL (100 mLs Intravenous Contrast Given 01/12/20 1255)    ED Course  I have reviewed the triage vital signs and the nursing notes.  Pertinent labs & imaging results that were available during my care of the patient were reviewed by me and considered in my medical decision making (see chart for details).    MDM Rules/Calculators/A&P                      BP 112/66   Pulse 93   Temp 100 F (37.8 C) (Oral)   Resp 16   SpO2 97%   Final Clinical Impression(s) / ED Diagnoses Final diagnoses:  Pyelonephritis  Intractable vomiting with nausea    Rx / DC Orders ED Discharge Orders    None     9:38 AM Patient report urinary frequency urgency as well as having right flank pain.  She does have an elevated temperature, mildly tachycardic  suggestive of an underlying infection.  She has elevated white count of 14.1.  Her pregnancy test is negative.  Symptoms suggestive of pyelonephritis versus kidney stone, versus  appendicitis.  Will obtain abdominal pelvic CT scan for further evaluation.  12:38 PM No significant discomfort on pelvic examination to suggest PID.  Wet prep did show evidence of many WBC.  UA with positive nitrite, moderate leukocyte esterase, and many bacteria consistent with UTI.  In the setting of urinary tract infection back pain with fever, finding consistent with pyelonephritis.  Will give Rocephin  1:44 PM CT scan shows diffuse abnormal right urothelial enhancement compatible with ascending urinary tract infection and very mild pyelonephritis.  Normal appendix.  Findings consistent with patient presenting complaint.  However at this time, patient endorsed persistent nausea and vomiting unable to keep anything down.  She has received multiple dose of antinausea medication without relief.  Will admit patient for further management of pyelonephritis not able to tolerate p.o. at this time.  Covid screening test ordered.  2:16 PM Appreciate consultation from [facility] Dr. Dan Maker who agrees to see and admit pt.  She also request for blood cultures and urine culture.    2:19 PM Patient developed hives after administration of morphine.  Benadryl given.  Will monitor closely.  Kellie [name] was evaluated in Emergency Department on 01/12/2020 for the symptoms described in the history of present illness. She was evaluated in the context of the global COVID-19 pandemic, which necessitated consideration that the patient might be at risk for infection with the SARS-CoV-2 virus that causes COVID-19. Institutional protocols and algorithms that pertain to the evaluation of patients at risk for COVID-19 are in a state of rapid change based on information released by regulatory bodies including the CDC and federal and state  organizations. These policies and algorithms were followed during the patient's care in the ED.    Domenic Moras, PA-C 01/12/20 1420    Davonna Belling, MD 01/12/20 1515    Domenic Moras, PA-C 01/12/20 1523    Davonna Belling, MD 01/13/20 [phone]

## 2020-01-12 NOTE — H&P (Signed)
History and Physical    Kellie [name] DOB: August 02, 1991 DOA: 01/12/2020  PCP: Milus Height, PA  Patient coming from: Home I have personally briefly reviewed patient's old medical records in [facility]  Chief Complaint: Right flank pain, nausea and vomiting since 2 days  HPI: Kellie [name] is a 29 y.o. female with medical history significant of acid reflux, tobacco abuse, alcohol abuse presents to emergency department with right flank pain, nausea and vomiting since 2 days.  Patient reports that his pain is severe, nonradiating, crampy/sharp in nature, worse with positional change associated with multiple episodes of vomiting, fever, chills, lower abdominal pain, shortness of breath and generalized weakness.  Denies dysuria, hematuria, previous history of similar symptoms, vaginal discharge, new sexual partner, history of STDs, headache, blurry vision, chest pain, bowel changes.  She lives with her boyfriend, smokes 2 to 3 cigarettes/day, drinks alcohol every day, denies illicit drug use.  LMP: 01/07/2020.  ED Course: In the ED patient had fever of 100, CBC shows leukocytosis of 14.1, UA positive for infection, HIV/RPR: Negative, GC chlamydia and COVID-19 test pending.  CT abdomen/pelvis shows diffusely abnormal right urothelial enhancement compatible with ascending UTI and mild right pyelonephritis.  Patient received, Rocephin, morphine, Zofran and Phenergan in the ED.  Review of Systems: As per HPI otherwise negative.    Past Medical History:  Diagnosis Date  . GERD (gastroesophageal reflux disease)     Past Surgical History:  Procedure Laterality Date  . BUNIONECTOMY Bilateral      reports that she has been smoking cigarettes. She has been smoking about 0.50 packs per day. She has never used smokeless tobacco. She reports current alcohol use of about 6.0 standard drinks of alcohol per week. She reports that she does not use drugs.  No Known  Allergies  Family History  Problem Relation Age of Onset  . Ovarian cancer Maternal Aunt   . Diabetes Paternal Aunt   . Breast cancer Paternal Aunt        x 2  . Diabetes Paternal Uncle   . Diabetes Maternal Grandmother   . Colon cancer Maternal Grandmother   . Heart disease Maternal Grandmother   . Heart disease Maternal Grandfather   . Diabetes Paternal Grandmother   . Heart disease Paternal Grandmother   . Diabetes Paternal Grandfather   . Heart disease Paternal Grandfather     Prior to Admission medications   Medication Sig Start Date End Date Taking? Authorizing Provider  omeprazole (PRILOSEC) 40 MG capsule Take 1 capsule by mouth as needed (acid reflux).  11/23/14  Yes [provider]  baclofen (LIORESAL) 10 MG tablet Take 1 tablet (10 mg total) 3 (three) times daily by mouth. Patient not taking: Reported on 01/12/2020 09/25/17   Arthor Captain, PA-C    Physical Exam: Vitals:   01/12/20 1205 01/12/20 1230 01/12/20 1400 01/12/20 1522  BP: 111/75 112/66 122/76 108/70  Pulse: 90 93 (!) 107 100  Resp: 16   16  Temp:    99.4 F (37.4 C)  TempSrc:    Oral  SpO2: 100% 97% 100% 100%    Constitutional: In mild distress, spitting/vomiting during encounter Eyes: PERRL, lids and conjunctivae normal ENMT: Mucous membranes are moist. Posterior pharynx clear of any exudate or lesions.Normal dentition.  Neck: normal, supple, no masses, no thyromegaly Respiratory: clear to auscultation bilaterally, no wheezing, no crackles. Normal respiratory effort. No accessory muscle use.  Cardiovascular: Regular rate and rhythm, no murmurs / rubs / gallops.  No extremity edema. 2+ pedal pulses. No carotid bruits.  Abdomen: no tenderness, no masses palpated. No hepatosplenomegaly. Bowel sounds positive.  Musculoskeletal: no clubbing / cyanosis. No joint deformity upper and lower extremities. Good ROM, no contractures. Normal muscle tone.  Right costovertebral tenderness positive. Skin: no  rashes, lesions, ulcers. No induration Neurologic: CN 2-12 grossly intact. Sensation intact, DTR normal. Strength 5/5 in all 4.  Psychiatric: Normal judgment and insight. Alert and oriented x 3. Normal mood.    Labs on Admission: I have personally reviewed following labs and imaging studies  CBC: Recent Labs  Lab 01/12/20 0825  WBC 14.1*  HGB 14.6  HCT 43.3  MCV 91.4  PLT 676   Basic Metabolic Panel: Recent Labs  Lab 01/12/20 0825  NA 142  K 3.9  CL 105  CO2 22  GLUCOSE 100*  BUN 8  CREATININE 0.96  CALCIUM 9.2   GFR: CrCl cannot be calculated (Unknown ideal weight.). Liver Function Tests: Recent Labs  Lab 01/12/20 0825  AST 18  ALT 16  ALKPHOS 60  BILITOT 2.0*  PROT 6.9  ALBUMIN 4.3   Recent Labs  Lab 01/12/20 0825  LIPASE 34   No results for input(s): AMMONIA in the last 168 hours. Coagulation Profile: No results for input(s): INR, PROTIME in the last 168 hours. Cardiac Enzymes: No results for input(s): CKTOTAL, CKMB, CKMBINDEX, TROPONINI in the last 168 hours. BNP (last 3 results) No results for input(s): PROBNP in the last 8760 hours. HbA1C: No results for input(s): HGBA1C in the last 72 hours. CBG: No results for input(s): GLUCAP in the last 168 hours. Lipid Profile: No results for input(s): CHOL, HDL, LDLCALC, TRIG, CHOLHDL, LDLDIRECT in the last 72 hours. Thyroid Function Tests: No results for input(s): TSH, T4TOTAL, FREET4, T3FREE, THYROIDAB in the last 72 hours. Anemia Panel: No results for input(s): VITAMINB12, FOLATE, FERRITIN, TIBC, IRON, RETICCTPCT in the last 72 hours. Urine analysis:    Component Value Date/Time   COLORURINE YELLOW 01/12/2020 0917   APPEARANCEUR HAZY (A) 01/12/2020 0917   LABSPEC 1.012 01/12/2020 0917   PHURINE 9.0 (H) 01/12/2020 0917   GLUCOSEU NEGATIVE 01/12/2020 0917   HGBUR SMALL (A) 01/12/2020 0917   BILIRUBINUR NEGATIVE 01/12/2020 0917   KETONESUR NEGATIVE 01/12/2020 0917   PROTEINUR 30 (A) 01/12/2020  0917   UROBILINOGEN 0.2 02/20/2015 1452   NITRITE POSITIVE (A) 01/12/2020 0917   LEUKOCYTESUR MODERATE (A) 01/12/2020 0917    Radiological Exams on Admission: CT ABDOMEN PELVIS W CONTRAST  Result Date: 01/12/2020 CLINICAL DATA:  29 year old female with abdominal and right flank pain. EXAM: CT ABDOMEN AND PELVIS WITH CONTRAST TECHNIQUE: Multidetector CT imaging of the abdomen and pelvis was performed using the standard protocol following bolus administration of intravenous contrast. CONTRAST:  142mL OMNIPAQUE IOHEXOL 300 MG/ML  SOLN COMPARISON:  CT chest, abdomen and Pelvis 09/25/2017 FINDINGS: Lower chest: Negative. Hepatobiliary: Negative liver and gallbladder. No bile duct enlargement. Pancreas: Negative. Spleen: Negative; there are few small chronic calcified splenic granulomas. Adrenals/Urinary Tract: Normal adrenal glands. There is a differential enhancement of the kidneys, and asymmetric right renal pelvis and proximal ureter urothelial thickening (coronal image 60). The right nephrogram appear somewhat delayed, and there is some subtle indistinct hypoenhancement in the right renal upper pole. The right ureter appears to be hyperenhancing but is decompressed in the mid abdomen on series 3, image 42. And no calculus is identified along the course of the ureter. The distal right ureter also appears to be hyperenhancing on series 3,  image 71. The contralateral left ureter appears normal. No nephrolithiasis identified. Right perinephric stranding is primarily at the renal hilum. Mildly thick-walled but otherwise unremarkable urinary bladder. Stomach/Bowel: Redundant sigmoid colon with mild retained stool. Mild gas and stool in the descending colon. Negative rectum. Decompressed transverse colon. Mild retained stool in the right colon. The cecum is partially located in the pelvis. A normal appendix is visible along the right internal iliac vascular structures on series 6, image 69. Decompressed terminal  ileum. No dilated small bowel. No mesenteric stranding identified. Negative stomach and duodenum. No free air. No abdominal free fluid. Vascular/Lymphatic: Major arterial structures are patent and appear normal. Portal venous system is patent. Reproductive: Negative. Other: Small volume of pelvic free fluid mostly in the cul-de-sac, probably physiologic (series 3, image 67). Musculoskeletal: Negative. IMPRESSION: 1. Diffusely abnormal right urothelial enhancement compatible with Ascending Urinary Tract Infection. But there is only mildly abnormal enhancement of the right renal parenchyma; mild if any right pyelonephritis. 2. Normal left kidney and ureter. No urinary calculus or obstructive uropathy. 3. Small volume of pelvic free fluid is probably physiologic. 4. Normal appendix.  No bowel inflammation identified. Electronically Signed   By: Odessa Fleming M.D.   On: 01/12/2020 13:18    Assessment/Plan Active Problems:   Pyelonephritis of right kidney   Acid reflux   Tobacco abuse   Alcohol abuse   Pyelonephritis of right kidney: -Patient presented with right flank pain associated with fever, chills, nausea and vomiting.  CT abdomen/pelvis as above.  UA is positive for infection.  Lactic acid, urine culture and blood culture is pending.  Leukocytosis of 14.1, lipase: WNL. -HIV and RPR: Negative, GC/chlamydia: Pending -Patient received IV Rocephin in ED will continue same -Zofran as needed for nausea and vomiting, Toradol as needed for pain control. -Start gentle hydration, will keep her n.p.o. for now as patient is vomiting and will advance her diet as tolerated.  Acid reflux: -Patient takes PPI as needed for acid reflux at home -Hold for now.  Tobacco and alcohol abuse: -Counseled about cessation.  DVT prophylaxis: Lovenox, teds/SCD Code Status: Full code  family Communication: None present at bedside.  Plan of care discussed with patient in length and he verbalized understanding and agreed with  it. Disposition Plan: Likely home tomorrow a.m. Consults called: None Admission status: Observation   Ollen Bowl MD [facility] Pager [phone]  If 7PM-7AM, please contact night-coverage [email or web address] Password [email or web address]  01/12/2020, 4:00 PM

## 2020-01-12 NOTE — ED Notes (Signed)
Pt given IV morphine for pain control. Small area of raised bumps developed above antecubital IV site on left arm following administration. PA notified, 50 mg IV Benadryl administered with verbal order. Ice applied to affected area. Vital signs stable, airway patent, A&O x4. SpO2 100% on RA, BP 122/76, pulse 103.

## 2020-01-12 NOTE — ED Triage Notes (Signed)
Pt reports severe abd pain that radiates to her right flank, emesis and bilateral hand cramping since yesterday. Pt tearful in triage.

## 2020-01-13 DIAGNOSIS — N12 Tubulo-interstitial nephritis, not specified as acute or chronic: Secondary | ICD-10-CM | POA: Diagnosis not present

## 2020-01-13 LAB — GC/CHLAMYDIA PROBE AMP (~~LOC~~) NOT AT ARMC
Chlamydia: NEGATIVE
Neisseria Gonorrhea: NEGATIVE

## 2020-01-13 MED ORDER — LEVOFLOXACIN 750 MG PO TABS: 750.0000 mg | ORAL_TABLET | Freq: Every day | ORAL | 0 refills | Status: AC

## 2020-01-13 MED ORDER — LEVOFLOXACIN 500 MG PO TABS
750.0000 mg | ORAL_TABLET | Freq: Every day | ORAL | Status: DC
  Administered 2020-01-13: 750 mg via ORAL
  Filled 2020-01-13: qty 2

## 2020-01-13 NOTE — Discharge Instructions (Signed)
Pyelonephritis, Adult  Pyelonephritis is an infection that occurs in the kidney. The kidneys are organs that help clean the blood by moving waste out of the blood and into the pee (urine). This infection can happen quickly, or it can last for a long time. In most cases, it clears up with treatment and does not cause other problems. What are the causes? This condition may be caused by:  Germs (bacteria) going from the bladder up to the kidney. This may happen after having a bladder infection.  Germs going from the blood to the kidney. What increases the risk? This condition is more likely to develop in:  Pregnant women.  Older people.  People who have any of these conditions: ? Diabetes. ? Inflammation of the prostate gland (prostatitis), in males. ? Kidney stones or bladder stones. ? Other problems with the kidney or the parts of your body that carry pee from the kidneys to the bladder (ureters). ? Cancer.  People who have a small, thin tube (catheter) placed in the bladder.  People who are sexually active.  Women who use a medicine that kills sperm (spermicide) to prevent pregnancy.  People who have had a prior urinary tract infection (UTI). What are the signs or symptoms? Symptoms of this condition include:  Peeing often.  A strong urge to pee right away.  Burning or stinging when peeing.  Belly pain.  Back pain.  Pain in the side (flank area).  Fever or chills.  Blood in the pee, or dark pee.  Feeling sick to your stomach (nauseous) or throwing up (vomiting). How is this treated? This condition may be treated by:  Taking antibiotic medicines by mouth (orally).  Drinking enough fluids. If the infection is bad, you may need to stay in the hospital. You may be given antibiotics and fluids that are put directly into a vein through an IV tube. In some cases, other treatments may be needed. Follow these instructions at home: Medicines  Take your antibiotic  medicine as told by your doctor. Do not stop taking the antibiotic even if you start to feel better.  Take over-the-counter and prescription medicines only as told by your doctor. General instructions   Drink enough fluid to keep your pee pale yellow.  Avoid caffeine, tea, and carbonated drinks.  Pee (urinate) often. Avoid holding in pee for long periods of time.  Pee before and after sex.  After pooping (having a bowel movement), women should wipe from front to back. Use each tissue only once.  Keep all follow-up visits as told by your doctor. This is important. Contact a doctor if:  You do not feel better after 2 days.  Your symptoms get worse.  You have a fever. Get help right away if:  You cannot take your medicine or drink fluids as told.  You have chills and shaking.  You throw up.  You have very bad pain in your side or back.  You feel very weak or you pass out (faint). Summary  Pyelonephritis is an infection that occurs in the kidney.  In most cases, this infection clears up with treatment and does not cause other problems.  Take your antibiotic medicine as told by your doctor. Do not stop taking the antibiotic even if you start to feel better.  Drink enough fluid to keep your pee pale yellow. This information is not intended to replace advice given to you by your health care provider. Make sure you discuss any questions you have with   your health care provider. Document Revised: 08/31/2018 Document Reviewed: 08/31/2018 Elsevier Patient Education  2020 Elsevier Inc.  

## 2020-01-13 NOTE — Discharge Summary (Signed)
Physician Discharge Summary  Kellie [name] IFO:[id number] DOB: [birth year] DOA: 01/12/2020  PCP: Milus Height, PA  Admit date: 01/12/2020 Discharge date: 01/13/2020  Admitted From: Home Disposition: Home  Recommendations for Outpatient Follow-up:  1. Follow up with PCP in 1-2 weeks 2. Please obtain BMP/CBC in one week 3. Please follow up on the following pending results:  Home Health: None Equipment/Devices: None  Discharge Condition: Stable CODE STATUS: Full code Diet recommendation: Regular  Subjective: Seen and examined.  She states that she is feeling much better and did not have any further flank pain or any other complaint.  Wanted to go home.  HPI: Kellie [name] is a 29 y.o. female with medical history significant of acid reflux, tobacco abuse, alcohol abuse presents to emergency department with right flank pain, nausea and vomiting since 2 days.  Patient reports that his pain is severe, nonradiating, crampy/sharp in nature, worse with positional change associated with multiple episodes of vomiting, fever, chills, lower abdominal pain, shortness of breath and generalized weakness.  Denies dysuria, hematuria, previous history of similar symptoms, vaginal discharge, new sexual partner, history of STDs, headache, blurry vision, chest pain, bowel changes.  She lives with her boyfriend, smokes 2 to 3 cigarettes/day, drinks alcohol every day, denies illicit drug use.  LMP: 01/07/2020.  ED Course: In the ED patient had fever of 100, CBC shows leukocytosis of 14.1, UA positive for infection, HIV/RPR: Negative, GC chlamydia and COVID-19 test pending.  CT abdomen/pelvis shows diffusely abnormal right urothelial enhancement compatible with ascending UTI and mild right pyelonephritis.  Patient received, Rocephin, morphine, Zofran and Phenergan in the ED.  Brief/Interim Summary: Patient was admitted with right pyelonephritis she was started on Rocephin.  When I saw her this morning, her  boyfriend was at the bedside.  She had no further flank pain.  On examination, there was no CVA tenderness.  She refused morning labs but her vitals were stable without any further fever.  She wanted to go home.  I did explain to her that it was important for [place] to do her lab work to follow WBC however she refused that.  I also informed her that her urine culture is still negative till date and although we can discharge her on based empiric antibiotic, Levaquin 750 mg p.o. daily however there is always a chance that she may grow a bacteria in the urine which might be resistant to Levaquin.  She understood and still requested to discharge her.  She received 1 dose of Levaquin 750 mg p.o. here and I have prescribed her 4 more doses for next 4 days.  I strongly recommended her to call her PCP on Monday morning and get seen.  If she cannot be scheduled with PCP then I strongly recommended her to ask about her urine culture results from her PCP.  She verbalized understanding.  Discharge Diagnoses:  Active Problems:   Pyelonephritis of right kidney   Acid reflux   Tobacco abuse   Alcohol abuse    Discharge Instructions  Discharge Instructions    Discharge patient   Complete by: As directed    Discharge disposition: 01-Home or Self Care   Discharge patient date: 01/13/2020     Allergies as of 01/13/2020   No Known Allergies     Medication List    TAKE these medications   baclofen 10 MG tablet Commonly known as: LIORESAL Take 1 tablet (10 mg total) 3 (three) times daily by mouth.   levofloxacin 750 MG tablet  Commonly known as: LEVAQUIN Take 1 tablet (750 mg total) by mouth daily for 4 days. Start taking on: January 14, 2020   omeprazole 40 MG capsule Commonly known as: PRILOSEC Take 1 capsule by mouth as needed (acid reflux).      Follow-up Information    Redmon, Noelle, PA Follow up in 1 week(s).   Specialty: Physician Assistant Contact information: [address] [employer] [place] [address] [phone]          No Known Allergies  Consultations: None   Procedures/Studies: CT ABDOMEN PELVIS W CONTRAST  Result Date: 01/12/2020 CLINICAL DATA:  29 year old female with abdominal and right flank pain. EXAM: CT ABDOMEN AND PELVIS WITH CONTRAST TECHNIQUE: Multidetector CT imaging of the abdomen and pelvis was performed using the standard protocol following bolus administration of intravenous contrast. CONTRAST:  138mL OMNIPAQUE IOHEXOL 300 MG/ML  SOLN COMPARISON:  CT chest, abdomen and Pelvis 09/25/2017 FINDINGS: Lower chest: Negative. Hepatobiliary: Negative liver and gallbladder. No bile duct enlargement. Pancreas: Negative. Spleen: Negative; there are few small chronic calcified splenic granulomas. Adrenals/Urinary Tract: Normal adrenal glands. There is a differential enhancement of the kidneys, and asymmetric right renal pelvis and proximal ureter urothelial thickening (coronal image 60). The right nephrogram appear somewhat delayed, and there is some subtle indistinct hypoenhancement in the right renal upper pole. The right ureter appears to be hyperenhancing but is decompressed in the mid abdomen on series 3, image 42. And no calculus is identified along the course of the ureter. The distal right ureter also appears to be hyperenhancing on series 3, image 71. The contralateral left ureter appears normal. No nephrolithiasis identified. Right perinephric stranding is primarily at the renal hilum. Mildly thick-walled but otherwise unremarkable urinary bladder. Stomach/Bowel: Redundant sigmoid colon with mild retained stool. Mild gas and stool in the descending colon. Negative rectum. Decompressed transverse colon. Mild retained stool in the right colon. The cecum is partially located in the pelvis. A normal appendix is visible along the right internal iliac vascular structures on series 6, image 69. Decompressed terminal ileum. No dilated small bowel. No mesenteric  stranding identified. Negative stomach and duodenum. No free air. No abdominal free fluid. Vascular/Lymphatic: Major arterial structures are patent and appear normal. Portal venous system is patent. Reproductive: Negative. Other: Small volume of pelvic free fluid mostly in the cul-de-sac, probably physiologic (series 3, image 67). Musculoskeletal: Negative. IMPRESSION: 1. Diffusely abnormal right urothelial enhancement compatible with Ascending Urinary Tract Infection. But there is only mildly abnormal enhancement of the right renal parenchyma; mild if any right pyelonephritis. 2. Normal left kidney and ureter. No urinary calculus or obstructive uropathy. 3. Small volume of pelvic free fluid is probably physiologic. 4. Normal appendix.  No bowel inflammation identified. Electronically Signed   By: Genevie Ann M.D.   On: 01/12/2020 13:18     Discharge Exam: Vitals:   01/12/20 2347 01/13/20 0642  BP: 109/74 100/70  Pulse: 81 83  Resp: 18 18  Temp: 99 F (37.2 C) 99.2 F (37.3 C)  SpO2: 99% 99%   Vitals:   01/12/20 1522 01/12/20 1807 01/12/20 2347 01/13/20 0642  BP: 108/70 126/76 109/74 100/70  Pulse: 100 98 81 83  Resp: 16 18 18 18   Temp: 99.4 F (37.4 C) 97.6 F (36.4 C) 99 F (37.2 C) 99.2 F (37.3 C)  TempSrc: Oral Oral Oral Oral  SpO2: 100% 100% 99% 99%    General: Pt is alert, awake, not in acute distress Cardiovascular: RRR, S1/S2 +,  no rubs, no gallops Respiratory: CTA bilaterally, no wheezing, no rhonchi Abdominal: Soft, NT, ND, bowel sounds + Extremities: no edema, no cyanosis    The results of significant diagnostics from this hospitalization (including imaging, microbiology, ancillary and laboratory) are listed below for reference.     Microbiology: Recent Results (from the past 240 hour(s))  Urine culture     Status: Abnormal (Preliminary result)   Collection Time: 01/12/20  9:14 AM   Specimen: Urine, Random  Result Value Ref Range Status   Specimen Description  URINE, RANDOM  Final   Special Requests NONE  Final   Culture (A)  Final    >=100,000 COLONIES/mL GRAM NEGATIVE RODS IDENTIFICATION AND SUSCEPTIBILITIES TO FOLLOW Performed at [facility], [address], [place], [place] [address]    Report Status PENDING  Incomplete  Wet prep, genital     Status: Abnormal   Collection Time: 01/12/20  9:34 AM   Specimen: PATH Cytology Cervicovaginal Ancillary Only  Result Value Ref Range Status   Yeast Wet Prep HPF POC NONE SEEN NONE SEEN Final   Trich, Wet Prep NONE SEEN NONE SEEN Final   Clue Cells Wet Prep HPF POC NONE SEEN NONE SEEN Final   WBC, Wet Prep HPF POC MANY (A) NONE SEEN Final   Sperm NONE SEEN  Final    Comment: Performed at [facility], [address], [place], [place] [address]  SARS CORONAVIRUS 2 (TAT 6-24 HRS) Nasopharyngeal Nasopharyngeal Swab     Status: None   Collection Time: 01/12/20  1:56 PM   Specimen: Nasopharyngeal Swab  Result Value Ref Range Status   SARS Coronavirus 2 NEGATIVE NEGATIVE Final    Comment: (NOTE) SARS-CoV-2 target nucleic acids are NOT DETECTED. The SARS-CoV-2 RNA is generally detectable in upper and lower respiratory specimens during the acute phase of infection. Negative results do not preclude SARS-CoV-2 infection, do not rule out co-infections with other pathogens, and should not be used as the sole basis for treatment or other patient management decisions. Negative results must be combined with clinical observations, patient history, and epidemiological information. The expected result is Negative. Fact Sheet for Patients: HairSlick.no Fact Sheet for Healthcare Providers: quierodirigir.com This test is not yet approved or cleared by the [place] FDA and  has been authorized for detection and/or diagnosis of SARS-CoV-2 by FDA under an Emergency Use Authorization (EUA). This EUA will remain  in effect (meaning this test can  be used) for the duration of the COVID-19 declaration under Section 56 4(b)(1) of the Act, 21 U.S.C. section 360bbb-3(b)(1), unless the authorization is terminated or revoked sooner. Performed at [facility], [address], [place], [place] [address]      Labs: BNP (last 3 results) No results for input(s): BNP in the last 8760 hours. Basic Metabolic Panel: Recent Labs  Lab 01/12/20 0825 01/12/20 1556  NA 142  --   K 3.9  --   CL 105  --   CO2 22  --   GLUCOSE 100*  --   BUN 8  --   CREATININE 0.96 0.86  CALCIUM 9.2  --    Liver Function Tests: Recent Labs  Lab 01/12/20 0825  AST 18  ALT 16  ALKPHOS 60  BILITOT 2.0*  PROT 6.9  ALBUMIN 4.3   Recent Labs  Lab 01/12/20 0825  LIPASE 34   No results for input(s): AMMONIA in the last 168 hours. CBC: Recent Labs  Lab 01/12/20 0825  01/12/20 1556  WBC 14.1* 16.2*  HGB 14.6 14.0  HCT 43.3 40.4  MCV 91.4 89.2  PLT 239 197   Cardiac Enzymes: No results for input(s): CKTOTAL, CKMB, CKMBINDEX, TROPONINI in the last 168 hours. BNP: Invalid input(s): POCBNP CBG: No results for input(s): GLUCAP in the last 168 hours. D-Dimer No results for input(s): DDIMER in the last 72 hours. Hgb A1c No results for input(s): HGBA1C in the last 72 hours. Lipid Profile No results for input(s): CHOL, HDL, LDLCALC, TRIG, CHOLHDL, LDLDIRECT in the last 72 hours. Thyroid function studies No results for input(s): TSH, T4TOTAL, T3FREE, THYROIDAB in the last 72 hours.  Invalid input(s): FREET3 Anemia work up No results for input(s): VITAMINB12, FOLATE, FERRITIN, TIBC, IRON, RETICCTPCT in the last 72 hours. Urinalysis    Component Value Date/Time   COLORURINE YELLOW 01/12/2020 0917   APPEARANCEUR HAZY (A) 01/12/2020 0917   LABSPEC 1.012 01/12/2020 0917   PHURINE 9.0 (H) 01/12/2020 0917   GLUCOSEU NEGATIVE 01/12/2020 0917   HGBUR SMALL (A) 01/12/2020 0917   BILIRUBINUR NEGATIVE 01/12/2020 0917   KETONESUR NEGATIVE  01/12/2020 0917   PROTEINUR 30 (A) 01/12/2020 0917   UROBILINOGEN 0.2 02/20/2015 1452   NITRITE POSITIVE (A) 01/12/2020 0917   LEUKOCYTESUR MODERATE (A) 01/12/2020 0917   Sepsis Labs Invalid input(s): PROCALCITONIN,  WBC,  LACTICIDVEN Microbiology Recent Results (from the past 240 hour(s))  Urine culture     Status: Abnormal (Preliminary result)   Collection Time: 01/12/20  9:14 AM   Specimen: Urine, Random  Result Value Ref Range Status   Specimen Description URINE, RANDOM  Final   Special Requests NONE  Final   Culture (A)  Final    >=100,000 COLONIES/mL GRAM NEGATIVE RODS IDENTIFICATION AND SUSCEPTIBILITIES TO FOLLOW Performed at [facility], [address], [place], [place] [address]    Report Status PENDING  Incomplete  Wet prep, genital     Status: Abnormal   Collection Time: 01/12/20  9:34 AM   Specimen: PATH Cytology Cervicovaginal Ancillary Only  Result Value Ref Range Status   Yeast Wet Prep HPF POC NONE SEEN NONE SEEN Final   Trich, Wet Prep NONE SEEN NONE SEEN Final   Clue Cells Wet Prep HPF POC NONE SEEN NONE SEEN Final   WBC, Wet Prep HPF POC MANY (A) NONE SEEN Final   Sperm NONE SEEN  Final    Comment: Performed at [facility], [address] [place], [place] [address]  SARS CORONAVIRUS 2 (TAT 6-24 HRS) Nasopharyngeal Nasopharyngeal Swab     Status: None   Collection Time: 01/12/20  1:56 PM   Specimen: Nasopharyngeal Swab  Result Value Ref Range Status   SARS Coronavirus 2 NEGATIVE NEGATIVE Final    Comment: (NOTE) SARS-CoV-2 target nucleic acids are NOT DETECTED. The SARS-CoV-2 RNA is generally detectable in upper and lower respiratory specimens during the acute phase of infection. Negative results do not preclude SARS-CoV-2 infection, do not rule out co-infections with other pathogens, and should not be used as the sole basis for treatment or other patient management decisions. Negative results must be combined with clinical  observations, patient history, and epidemiological information. The expected result is Negative. Fact Sheet for Patients: HairSlick.no Fact Sheet for Healthcare Providers: quierodirigir.com This test is not yet approved or cleared by the [place] FDA and  has been authorized for detection and/or diagnosis of SARS-CoV-2 by FDA under an Emergency Use Authorization (EUA). This EUA will remain  in effect (meaning this  test can be used) for the duration of the COVID-19 declaration under Section 56 4(b)(1) of the Act, 21 U.S.C. section 360bbb-3(b)(1), unless the authorization is terminated or revoked sooner. Performed at [facility], [address] [place], [place] [address]      Time coordinating discharge: Over 30 minutes  SIGNED:   Hughie Closs, MD  [facility] 01/13/2020, 11:50 AM  If 7PM-7AM, please contact night-coverage [email or web address]

## 2020-01-13 NOTE — Progress Notes (Signed)
Pt refused am labs.

## 2020-01-14 LAB — URINE CULTURE: Culture: >=100000 — AB

## 2020-01-17 LAB — CULTURE, BLOOD (ROUTINE X 2)
Culture: NO GROWTH
Culture: NO GROWTH
Special Requests: ADEQUATE

## 2020-03-09 ENCOUNTER — Other Ambulatory Visit: Payer: Self-pay

## 2020-03-09 ENCOUNTER — Ambulatory Visit: Payer: 59 | Admitting: Podiatry

## 2020-03-09 ENCOUNTER — Ambulatory Visit (INDEPENDENT_AMBULATORY_CARE_PROVIDER_SITE_OTHER): Payer: 59

## 2020-03-09 ENCOUNTER — Encounter: Payer: Self-pay | Admitting: Podiatry

## 2020-03-09 DIAGNOSIS — M779 Enthesopathy, unspecified: Secondary | ICD-10-CM

## 2020-03-09 DIAGNOSIS — M2042 Other hammer toe(s) (acquired), left foot: Secondary | ICD-10-CM

## 2020-03-09 NOTE — Progress Notes (Signed)
Subjective:   Patient ID: Kellie [name], female   DOB: 29 y.o.   MRN: [id number]   HPI Patient states over the last few months I have had some pain in my left foot and the joints and I did dislocate my third toe on my left foot playing sports and its been bothering me and get worse over the last few years.  Also my other digits I want them checked and I am satisfied with my bunions that were corrected 7 years ago.  Patient does not smoke currently and would like to be more active   Review of Systems  All other systems reviewed and are negative.       Objective:  Physical Exam Vitals and nursing note reviewed.  Constitutional:      Appearance: She is well-developed.  Pulmonary:     Effort: Pulmonary effort is normal.  Musculoskeletal:        General: Normal range of motion.  Skin:    General: Skin is warm.  Neurological:     Mental Status: She is alert.     Neurovascular status intact muscle strength adequate range of motion within normal limits.  Patient is noted to have inflammation pain third and fourth metatarsal phalangeal joints left with well-healed surgical sites first fifth metatarsals digits with some compression of the lesser digits bilateral and indications the third digit left has been dislocated secondary to previous injury.  Patient has good digital perfusion well oriented x3     Assessment:  Inflammatory capsulitis third and fifth metatarsophalangeal joints left digital deformity dislocation secondary to injury third left and well-healed surgical site right and left first fifth metatarsal     Plan:  H&P x-rays reviewed today I did sterile anesthetic block of the forefoot left and under sterile conditions did aspiration his third and fourth MPJs did not note any type of fluid abnormalities injected the joint afterwards with quarter cc dexamethasone Kenalog applied thick pad discussed possible surgery for the third digit and reappoint 2 weeks to see results.  May  require orthotics and patient is encouraged to call with questions  X-rays indicate that there has been previous surgery with what appears to be dislocation of the proximal joint third digit left secondary to injury with well corrected first and fifth metatarsal bilateral fixation in place

## 2020-03-23 ENCOUNTER — Ambulatory Visit: Payer: 59 | Admitting: Podiatry

## 2020-03-23 ENCOUNTER — Other Ambulatory Visit: Payer: Self-pay

## 2020-03-23 ENCOUNTER — Encounter: Payer: Self-pay | Admitting: Podiatry

## 2020-03-23 DIAGNOSIS — M779 Enthesopathy, unspecified: Secondary | ICD-10-CM

## 2020-03-26 NOTE — Progress Notes (Signed)
Subjective:   Patient ID: Kellie [name], female   DOB: 29 y.o.   MRN: [id number]   HPI Patient states she did get quite a bit of improvement with the medication and states while she still has mild pain significantly better than previous   ROS      Objective:  Physical Exam  Neurovascular status intact with patient's capsule on the left improved quite a bit with discomfort still noted upon deep palpation but better than previous     Assessment:  Capsulitis left improved     Plan:  Reviewed utilization of padding rigid bottom shoes anti-inflammatories as needed and topical as needed.  Reappoint if any symptoms were to recur

## 2020-07-11 ENCOUNTER — Ambulatory Visit (INDEPENDENT_AMBULATORY_CARE_PROVIDER_SITE_OTHER): Payer: 59 | Admitting: Obstetrics and Gynecology

## 2020-07-11 ENCOUNTER — Encounter: Payer: Self-pay | Admitting: Obstetrics and Gynecology

## 2020-07-11 ENCOUNTER — Other Ambulatory Visit: Payer: Self-pay

## 2020-07-11 VITALS — BP 116/74 | Ht 65.0 in | Wt 120.0 lb

## 2020-07-11 DIAGNOSIS — Z3009 Encounter for other general counseling and advice on contraception: Secondary | ICD-10-CM

## 2020-07-11 DIAGNOSIS — Z01419 Encounter for gynecological examination (general) (routine) without abnormal findings: Secondary | ICD-10-CM | POA: Diagnosis not present

## 2020-07-11 DIAGNOSIS — N939 Abnormal uterine and vaginal bleeding, unspecified: Secondary | ICD-10-CM

## 2020-07-11 DIAGNOSIS — Z124 Encounter for screening for malignant neoplasm of cervix: Secondary | ICD-10-CM | POA: Diagnosis not present

## 2020-07-11 LAB — PREGNANCY, URINE: Preg Test, Ur: NEGATIVE

## 2020-07-11 MED ORDER — NORETHIN ACE-ETH ESTRAD-FE 1-20 MG-MCG PO TABS: 1.0000 | ORAL_TABLET | Freq: Every day | ORAL | 4 refills | Status: DC

## 2020-07-11 NOTE — Patient Instructions (Signed)
Intrauterine Device Information An intrauterine device (IUD) is a medical device that is inserted in the uterus to prevent pregnancy. It is a small, T-shaped device that has one or two nylon strings hanging down from it. The strings hang out of the lower part of the uterus (cervix) to allow for future IUD removal. There are two types of IUDs available:  Hormone IUD. This type of IUD is made of plastic and contains the hormone progestin (synthetic progesterone). A hormone IUD may last 3-5 years.  Copper IUD. This type of IUD has copper wire wrapped around it. A copper IUD may last up to 10 years. How is an IUD inserted? An IUD is inserted through the vagina and placed into the uterus with a minor medical procedure. The exact procedure for IUD insertion may vary among health care providers and hospitals. How does an IUD work? Synthetic progesterone in a hormonal IUD prevents pregnancy by:  Thickening cervical mucus to prevent sperm from entering the uterus.  Thinning the uterine lining to prevent a fertilized egg from being implanted there. Copper in a copper IUD prevents pregnancy by making the uterus and fallopian tubes produce a fluid that kills sperm. What are the advantages of an IUD? Advantages of either type of IUD  It is highly effective in preventing pregnancy.  It is reversible. You can become pregnant shortly after the IUD is removed.  It is low-maintenance and can stay in place for a long time.  There are no estrogen-related side effects.  It can be used when breastfeeding.  It is not associated with weight gain.  It can be inserted right after childbirth, an abortion, or a miscarriage. Advantages of a hormone IUD  If it is inserted within 7 days of your period starting, it works right after it is inserted. If the hormone IUD is inserted at any other time in your cycle, you will need to use a backup method of birth control for 7 days after insertion.  It can make  menstrual periods lighter.  It can reduce menstrual cramping.  It can be used for 3-5 years. Advantages of a copper IUD  It works right after it is inserted.  It can be used as a form of emergency birth control if it is inserted within 5 days after having unprotected sex.  It does not interfere with your body's natural hormones.  It can be used for 10 years. What are the disadvantages of an IUD?  An IUD may cause irregular menstrual bleeding for a period of time after insertion.  You may have pain during insertion and have cramping and vaginal bleeding after insertion.  An IUD may cut the uterus (uterine perforation) when it is inserted. This is rare.  An IUD may cause pelvic inflammatory disease (PID), which is an infection in the uterus and fallopian tubes. This is rare, and it usually happens during the first 20 days after the IUD is inserted.  A copper IUD can make your menstrual flow heavier and more painful. How is an IUD removed?  You will lie on your back with your knees bent and your feet in footrests (stirrups).  A device will be inserted into your vagina to spread apart the vaginal walls (speculum). This will allow your health care provider to see the strings attached to the IUD.  Your health care provider will use a small instrument (forceps) to grasp the IUD strings and pull firmly until the IUD is removed. You may have some discomfort   when the IUD is removed. Your health care provider may recommend taking over-the-counter pain relievers, such as ibuprofen, before the procedure. You may also have minor spotting for a few days after the procedure. The exact procedure for IUD removal may vary among health care providers and hospitals. Is the IUD right for me? Your health care provider will make sure you are a good candidate for an IUD and will discuss the advantages, disadvantages, and possible side effects with you. Summary  An intrauterine device (IUD) is a medical  device that is inserted in the uterus to prevent pregnancy. It is a small, T-shaped device that has one or two nylon strings hanging down from it.  A hormone IUD contains the hormone progestin (synthetic progesterone). A copper IUD has copper wire wrapped around it.  Synthetic progesterone in a hormone IUD prevents pregnancy by thickening cervical mucus and thinning the walls of the uterus. Copper in a copper IUD prevents pregnancy by making the uterus and fallopian tubes produce a fluid that kills sperm.  A hormone IUD can be left in place for 3-5 years. A copper IUD can be left in place for up to 10 years.  An IUD is inserted and removed by a health care provider. You may feel some pain during insertion and removal. Your health care provider may recommend taking over-the-counter pain medicine, such as ibuprofen, before an IUD procedure. This information is not intended to replace advice given to you by your health care provider. Make sure you discuss any questions you have with your health care provider. Document Revised: 10/09/2017 Document Reviewed: 11/25/2016 Elsevier Patient Education  2020 [employer].    Contraceptive Implant Information A contraceptive implant is a small, plastic rod that is inserted under the skin. The implant releases a hormone into the bloodstream that prevents pregnancy. Contraceptive implants can be effective for up to 3 years. They do not provide protection against STIs (sexually transmitted infections). How does the implant work? Contraceptive implants prevent pregnancy by releasing a small amount of progestin into the bloodstream. Progestin has similar effects to the hormone progesterone, which plays a role in menstrual periods and pregnancy. Progestin will:  Stop the ovaries from releasing eggs.  Thicken cervical mucus to prevent sperm from entering the cervix.  Thin out the lining of the uterus to prevent a fertilized egg from attaching to the wall of the  uterus. What are the advantages of this form of birth control? The advantages of this form of birth control include the following:  It is very effective at preventing pregnancy.  It is effective for up to 3 years.  It can easily be removed.  It does not interfere with sex or daily activities.  It can be used when breastfeeding.  It can be used by women who cannot take estrogen.  The procedure to insert the device is quick.  Women can get pregnant shortly after removing the device. What are the disadvantages of this form of birth control? The disadvantages of this form of birth control include the following:  It can cause side effects, including: ? Irregular menstrual periods or bleeding. ? Headache. ? Weight gain. ? Acne. ? Breast tenderness. ? Abdomen (abdominal) pain. ? Mood changes, such as depression.  It does not protect against STIs.  You must make an office visit to have it inserted and removed by a trained clinician.  Inserting or removing the device can result in pain, scarring, and tissue or nerve damage (rare). How is this  implant inserted? The procedure to insert an implant only takes a few minutes. During the procedure:  Your upper arm will be numbed with a numbing medicine (local anesthetic).  The implant will be injected under the skin of your upper arm with a needle. After the procedure:  You may experience minor bruising, swelling, or discomfort at the insertion site. This should only last for a couple of days.  You may need to use another, non-hormonal contraceptive such as a condom for 7 days after the procedure. How is the implant removed? The implant should be removed after 3 years or as directed by your health care provider. The procedure to remove the implant only takes a few minutes. During this procedure:  Your upper arm will be numbed with a local anesthetic.  A small incision will be made near the implant.  The implant will be removed  with a small pair of forceps. After the implant is removed:  The effect of the implant will wear off a few hours after removal. Most women will be able to get pregnant within 3 weeks of removal.  A new implant can be inserted as soon as the old one is removed, if desired.  You may experience minor bruising, swelling, or discomfort at the removal site. This should only last for a couple of days. Is this implant right for me? Your health care provider can help you determine whether you are good candidate for a contraceptive implant. Make sure to discuss the possible side effects with your health care provider. You should not get the implant if you:  Are pregnant.  Are allergic to any part of the implant.  Have a history of: ? Breast cancer. ? Unusual bleeding from the vagina. ? Heart disease. ? Stroke. ? Liver disease or tumors. ? Migraines. Summary  A contraceptive implant is a small, plastic rod that is inserted under the skin. The implant releases a hormone into the bloodstream that prevents pregnancy.  Contraceptive implants can be effective for up to 3 years.  The implant works by preventing ovaries from releasing eggs, thickening the cervical mucus, and thinning the uterine wall.  This form of birth control is very effective at preventing pregnancy and can be inserted and removed quickly. Women can get pregnant shortly after the device is removed.  This form of birth control can cause some side effects, including weight gain, breast tenderness, headaches, irregular periods or bleeding, acne, abdominal pain, and depression. It does not provide protection against STIs (sexually transmitted infections). This information is not intended to replace advice given to you by your health care provider. Make sure you discuss any questions you have with your health care provider. Document Revised: 12/21/2018 Document Reviewed: 10/11/2016 Elsevier Patient Education  2020 [employer].

## 2020-07-11 NOTE — Progress Notes (Signed)
Kellie [name] 06/12/1991 [id number]  SUBJECTIVE:  29 y.o. G0P0 female for annual routine gynecologic exam and Pap smear. She has no gynecologic concerns. Came off depo over a year ago. Sexually active, not using any perception but is interested in starting. Has been on a few OCPs in the past which caused nausea and potentially some "hormonal" changes.  Generally did not feel well on them.  While on Depo but wanted to come off to allow bone remineralization. Wants to go school for nursing eventually, doing dialysis technician training.  Sexually active with her longtime boyfriend.  No STD concerns. Having some irregular bleeding in the last few months.   Current Outpatient Medications  Medication Sig Dispense Refill  . omeprazole (PRILOSEC) 40 MG capsule Take 40 mg by mouth daily.     No current facility-administered medications for this visit.   Allergies: Patient has no known allergies.  Patient's last menstrual period was 06/10/2020.  Past medical history,surgical history, problem list, medications, allergies, family history and social history were all reviewed and documented as reviewed in the EPIC chart.  ROS: Pertinent positives as noted in HPI, remainder ROS negative.  OBJECTIVE:  BP 116/74   Ht 5\' 5"  (1.651 m)   Wt 120 lb (54.4 kg)   LMP 06/10/2020   BMI 19.97 kg/m  The patient appears well, alert, oriented x 3, in no distress. Lungs are clear, good air entry, no wheezes, rhonchi or rales. S1 and S2 normal, no murmurs, regular rate and rhythm.  Abdomen soft without tenderness, guarding, mass or organomegaly.  Neurological is normal, no focal findings.  BREAST EXAM: breasts appear normal, no suspicious masses, no skin or nipple changes or axillary nodes  PELVIC EXAM: VULVA: normal appearing vulva with no masses, tenderness or lesions, VAGINA: normal appearing vagina with normal color and discharge, no lesions, CERVIX: normal appearing cervix without discharge or  lesions, UTERUS: uterus is normal size, shape, consistency and nontender, ADNEXA: normal adnexa in size, nontender and no masses, PAP: Pap smear done today, thin-prep method UPT negative  Chaperone: 08/10/2020 present during the examination  ASSESSMENT:  29 y.o. G0P0 here for annual gynecologic exam  PLAN:   1.  Abnormal uterine bleeding.  We discussed that Depo-Provera can have prolonged effects on bones leading to abnormal uterine bleeding in some cases.  We will also rule out pregnancy with UPT today.  She also has had stress with school in life so this can sometimes lead to menstrual changes.  Will check TSH today.  Menstrual pattern irregularity would be addressed by contraception as outlined below. 2.  Contraception counseling.  We spent time today discussing various effective hormonal birth control options including Skyla versus Mirena IUD, Nexplanon, NuvaRing.  Also revisited birth control pills.  She did well on Depo-Provera but I would agree with taking a break from that for at least a few years.  Discussed potential side effects of the LARC options I provided her with some information on her electronic as it summary.  She will let 30 know if she would like to proceed with one of those options but for now we will start with low-dose birth control pills, prescription for Junel equivalent is provided. 3. Pap smear 2016.  Repeat Pap smear is collected today. 4. Breast exam normal.  Self breast awareness encouraged. 5. HPV vaccination series completed. 6. No current STI concerns.  Recently had screening done 01/2020. 7. Health maintenance.  Check TSH today otherwise the routine screening labs are completed  elsewhere.  Return annually or sooner, prn.  Theresia Majors MD 07/11/20

## 2020-07-12 LAB — TSH: TSH: 0.7 mIU/L

## 2020-07-13 NOTE — Progress Notes (Signed)
Thank you :)

## 2020-07-17 LAB — PAP IG W/ RFLX HPV ASCU

## 2020-07-17 LAB — HUMAN PAPILLOMAVIRUS, HIGH RISK: HPV DNA High Risk: NOT DETECTED

## 2020-08-01 ENCOUNTER — Ambulatory Visit: Payer: 59 | Admitting: Obstetrics and Gynecology

## 2020-08-01 ENCOUNTER — Telehealth: Payer: Self-pay

## 2020-08-01 NOTE — Telephone Encounter (Signed)
Opened in error

## 2020-08-21 ENCOUNTER — Ambulatory Visit: Payer: 59 | Admitting: Obstetrics and Gynecology

## 2020-08-21 ENCOUNTER — Encounter: Payer: Self-pay | Admitting: Obstetrics and Gynecology

## 2020-08-21 ENCOUNTER — Other Ambulatory Visit: Payer: Self-pay

## 2020-08-21 VITALS — BP 118/76

## 2020-08-21 DIAGNOSIS — N870 Mild cervical dysplasia: Secondary | ICD-10-CM

## 2020-08-21 DIAGNOSIS — R87810 Cervical high risk human papillomavirus (HPV) DNA test positive: Secondary | ICD-10-CM | POA: Diagnosis not present

## 2020-08-21 DIAGNOSIS — R87612 Low grade squamous intraepithelial lesion on cytologic smear of cervix (LGSIL): Secondary | ICD-10-CM

## 2020-08-21 NOTE — Progress Notes (Signed)
   Kellie [name] 1991-04-04 [id number]  SUBJECTIVE:  29 y.o. G0P0 female presents for a colposcopy exam. At her routine Pap smear screening 07/11/2020 cytology result returned LGSIL. HPV negative.  Allergies: Patient has no known allergies.  Patient's last menstrual period was 08/01/2020.  Past medical history,surgical history, problem list, medications, allergies, family history and social history were all reviewed and documented as reviewed in the EPIC chart.   OBJECTIVE:  BP 118/76   LMP 08/01/2020  The patient appears well, alert, oriented, in no distress.  Physical Exam Genitourinary:       Colposcopy exam The cervix was visualized and a dilute acetic acid cleanse was performed.  Endocervical speculum was used to view the transformation zone in its entirety, so today's exam is adequate.  There were small collection of acetowhite change at 4:00 just outside of the external cervical os.  This area was biopsied with a Tischler biopsy forceps.  Application of silver nitrate cautery and pressure resulted in hemostasis.  Patient tolerated the procedure well.  Chaperone: Kennon Portela present during the examination  ASSESSMENT:  29 y.o. G0P0 here for colposcopy exam due to LGSIL Pap smear  PLAN:  We discussed the role of HPV mediating dysplastic cervical changes. Today's exam revealed some possible faint acetowhite changes which would most likely be representative of CIN-1/mild dysplasia.  Further management will be dictated by the results of today's biopsy and we will let the patient know about the results when available.   Theresia Majors MD 08/21/20

## 2020-08-21 NOTE — Patient Instructions (Signed)
Colposcopy Colposcopy is a procedure to examine the lowest part of the uterus (cervix), the vagina, and the area around the vaginal opening (vulva) for abnormalities or signs of disease. The procedure is done using a lighted microscope or magnifying lens (colposcope). If any unusual cells are found during the procedure, your health care provider may remove a tissue sample for testing (biopsy). A colposcopy may be done if you:  Have an abnormal Pap test. A Pap test is a screening test that is used to check for signs of cancer or infection of the vagina, cervix, and uterus.  Have a Pap smear test in which you test positive for high-risk HPV (human papillomavirus).  Have a sore or lesion on your cervix.  Have genital warts on your vulva, vagina, or cervix.  Took certain medicines while pregnant, such as diethylstilbestrol (DES).  Have pain during sexual intercourse.  Have vaginal bleeding, especially after sexual intercourse.  Need to have a cervical polyp removed.  Need to have a lost intrauterine device (IUD) string located. Let your health care provider know about:  Any allergies you have, including allergies to prescribed medicine, latex, or iodine.  All medicines you are taking, including vitamins, herbs, eye drops, creams, and over-the-counter medicines. Bring a list of all of your medicines to your appointment.  Any problems you or family members have had with anesthetic medicines.  Any blood disorders you have.  Any surgeries you have had.  Any medical conditions you have, such as pelvic inflammatory disease (PID) or endometrial disorder.  Any history of frequent fainting.  Your menstrual cycle and what form of birth control (contraception) you use.  Your medical history, including any prior cervical treatment.  Whether you are pregnant or may be pregnant. What are the risks? Generally, this is a safe procedure. However, problems may occur,  including:  Pain.  Infection, which may include a fever, bad-smelling discharge, or pelvic pain.  Bleeding or discharge.  Misdiagnosis.  Fainting and vasovagal reactions, but this is rare.  Allergic reactions to medicines.  Damage to other structures or organs. What happens before the procedure?  If you have your menstrual period or will have it at the time of your procedure, tell your health care provider. A colposcopy typically is not done during menstruation.  Continue your contraceptive practices before and after the procedure.  For 24 hours before the colposcopy: ? Do not douche. ? Do not use tampons. ? Do not use medicines, creams, or suppositories in the vagina. ? Do not have sexual intercourse.  Ask your health care provider about: ? Changing or stopping your regular medicines. This is especially important if you are taking diabetes medicines or blood thinners. ? Taking medicines such as aspirin and ibuprofen. These medicines can thin your blood. Do not take these medicines before your procedure if your health care provider instructs you not to. It is likely that your health care provider will tell you to avoid taking aspirin or medicine that contains aspirin for 7 days before the procedure.  Follow instructions from your health care provider about eating or drinking restrictions. You will likely need to eat a regular diet the day of the procedure and not skip any meals.  You may have an exam or testing. A pregnancy test will be taken on the day of the procedure.  You may have a blood or urine sample taken.  Plan to have someone take you home from the hospital or clinic.  If you will be going   home right after the procedure, plan to have someone with you for 24 hours. What happens during the procedure?  You will lie down on your back, with your feet in foot rests (stirrups).  A warmed and lubricated instrument (speculum) will be inserted into your vagina. The  speculum will be used to hold apart the walls of your vagina so your health care provider can see your cervix and the inside of your vagina.  A cotton swab will be used to place a small amount of liquid solution on the areas to be examined. This solution makes it easier to see abnormal cells. You may feel a slight burning during this part.  The colposcope will be used to scan the cervix with a bright white light. The colposcope will be held near your vulvaand will magnify your vulva, vagina, and cervix for easier examination.  Your health care provider may decide to take a biopsy. If so: ? You may be given medicine to numb the area (local anesthetic). ? Surgical instruments will be used to suck out mucus and cells through your vagina. ? You may feel mild pain while the tissue sample is removed. ? Bleeding may occur. A solution may be used to stop the bleeding. ? If a sample of tissue is needed from the inside of the cervix, a different procedure called endocervical curettage (ECC) may be completed. During this procedure, a curved instrument (curette) will be used to scrape cells from your cervix or the top of your cervix (endocervix).  Your health care provider will record the location of any abnormalities. The procedure may vary among health care providers and hospitals. What happens after the procedure?  You will lie down and rest for a few minutes. You may be offered juice or cookies.  Your blood pressure, heart rate, breathing rate, and blood oxygen level will be monitored until any medicines you were given have worn off.  You may have to wear compression stockings. These stockings help to prevent blood clots and reduce swelling in your legs.  You may have some cramping in your abdomen. This should go away after a few minutes. This information is not intended to replace advice given to you by your health care provider. Make sure you discuss any questions you have with your health care  provider. Document Revised: 10/09/2017 Document Reviewed: 06/02/2016 Elsevier Patient Education  2020 [employer]  

## 2020-08-23 LAB — TISSUE SPECIMEN

## 2020-08-23 LAB — PATHOLOGY REPORT

## 2022-04-23 LAB — HM PAP SMEAR

## 2022-09-11 DIAGNOSIS — Z20822 Contact with and (suspected) exposure to covid-19: Secondary | ICD-10-CM | POA: Diagnosis not present

## 2022-09-11 DIAGNOSIS — R059 Cough, unspecified: Secondary | ICD-10-CM | POA: Diagnosis not present

## 2022-09-11 DIAGNOSIS — J019 Acute sinusitis, unspecified: Secondary | ICD-10-CM | POA: Diagnosis not present

## 2022-09-16 DIAGNOSIS — Z6822 Body mass index (BMI) 22.0-22.9, adult: Secondary | ICD-10-CM | POA: Diagnosis not present

## 2022-09-16 DIAGNOSIS — R059 Cough, unspecified: Secondary | ICD-10-CM | POA: Diagnosis not present

## 2022-11-10 ENCOUNTER — Ambulatory Visit
Admission: EM | Admit: 2022-11-10 | Discharge: 2022-11-10 | Disposition: A | Discharge status: Home / Self Care | Payer: BC Managed Care – PPO | Attending: Physician Assistant | Admitting: Physician Assistant

## 2022-11-10 DIAGNOSIS — J111 Influenza due to unidentified influenza virus with other respiratory manifestations: Secondary | ICD-10-CM

## 2022-11-10 DIAGNOSIS — Z1152 Encounter for screening for COVID-19: Secondary | ICD-10-CM | POA: Insufficient documentation

## 2022-11-10 DIAGNOSIS — R051 Acute cough: Secondary | ICD-10-CM | POA: Insufficient documentation

## 2022-11-10 DIAGNOSIS — R509 Fever, unspecified: Secondary | ICD-10-CM | POA: Diagnosis not present

## 2022-11-10 LAB — POCT INFLUENZA A/B
Influenza A, POC: POSITIVE — AB
Influenza B, POC: NEGATIVE

## 2022-11-10 MED ORDER — AZITHROMYCIN 250 MG PO TABS: ORAL_TABLET | ORAL | 0 refills | Status: DC

## 2022-11-10 MED ORDER — ALBUTEROL SULFATE HFA 108 (90 BASE) MCG/ACT IN AERS
2.0000 | INHALATION_SPRAY | Freq: Once | RESPIRATORY_TRACT | Status: AC
  Administered 2022-11-10: 2 via RESPIRATORY_TRACT

## 2022-11-10 MED ORDER — ACETAMINOPHEN 500 MG PO TABS
1000.0000 mg | ORAL_TABLET | Freq: Once | ORAL | Status: AC
  Administered 2022-11-10: 1000 mg via ORAL

## 2022-11-10 MED ORDER — PROMETHAZINE-DM 6.25-15 MG/5ML PO SYRP: 5.0000 mL | ORAL_SOLUTION | Freq: Four times a day (QID) | ORAL | 0 refills | Status: DC | PRN

## 2022-11-10 MED ORDER — AEROCHAMBER PLUS FLO-VU MEDIUM MISC
1.0000 | Freq: Once | Status: AC
  Administered 2022-11-10: 1

## 2022-11-10 MED ORDER — OSELTAMIVIR PHOSPHATE 75 MG PO CAPS: 75.0000 mg | ORAL_CAPSULE | Freq: Two times a day (BID) | ORAL | 0 refills | Status: DC

## 2022-11-10 MED ORDER — IBUPROFEN 600 MG PO TABS: 600.0000 mg | ORAL_TABLET | Freq: Four times a day (QID) | ORAL | 0 refills | Status: AC | PRN

## 2022-11-10 NOTE — Discharge Instructions (Addendum)
Advised take ibuprofen 600 mg every 8 hours on a regular basis with food to help control the fever and body aches. Advised to use the albuterol inhaler with spacer, 2 puffs every 6 hours on a regular basis to control the cough, wheezing, shortness of breath. Advised take the Phenergan DM, 1 to 2 teaspoons every 6-8 hours as needed to control cough and congestion.  Test will be completed in 48 hours.  If you do not get a call from this office that indicates the test is negative, log onto MyChart to be the test results with post in 48 hours.

## 2022-11-10 NOTE — ED Provider Notes (Signed)
[facility]    CSN: [id number] Arrival date & time: 11/10/22  1509      History   Chief Complaint Chief Complaint  Patient presents with   Influenza    HPI Kellie [name] is a 32 y.o. female.   20-year-old female presents with fever, chills, shortness of breath and wheezing, body aches.  Patient indicates for the past 2 days she has been having increasing upper respiratory symptoms of sinus congestion with rhinitis, postnasal drip and purulent production being green.  Patient also indicates she is having cough with chest congestion and coughing exacerbations with shortness of breath and wheezing.  Patient indicates the production has been thick and green.  She also indicates that she started running fever 100-101 2 days ago which has been persistent and progressive associated with fatigue, lethargy, severe body aches and muscle pain and tenderness.  Patient indicates that she has been taking some OTC Tylenol and congestion medicine without relief of her symptoms.  Patient denies nausea or vomiting.  Patient indicates that she does vape on a regular basis but has not done so over the past 2 days when the symptoms started.  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  Patient does indicate that she had the flu vaccine this year.   Influenza Presenting symptoms: cough, fatigue, fever, rhinorrhea and shortness of breath   Associated symptoms: chills     Past Medical History:  Diagnosis Date   GERD (gastroesophageal reflux disease)     Patient Active Problem List   Diagnosis Date Noted   Pyelonephritis of right kidney 01/12/2020   Acid reflux 01/12/2020   Tobacco abuse 01/12/2020   Alcohol abuse 01/12/2020   BV (bacterial vaginosis) 01/16/2017    Past Surgical History:  Procedure  Laterality Date   BUNIONECTOMY Bilateral     OB History     Gravida  0   Para      Term      Preterm      AB      Living         SAB      IAB      Ectopic      Multiple      Live Births               Home Medications    Prior to Admission medications   Medication Sig Start Date End Date Taking? Authorizing Provider  azithromycin (ZITHROMAX Z-PAK) 250 MG tablet 2 tablets initially today and then 1 tablet daily until completed. 11/10/22  Yes Nyoka Lint, PA-C  ibuprofen (ADVIL) 600 MG tablet Take 1 tablet (600 mg total) by mouth every 6 (six) hours as needed. 11/10/22  Yes Nyoka Lint, PA-C  oseltamivir (TAMIFLU) 75 MG capsule Take 1 capsule (75 mg total) by mouth every 12 (twelve) hours. 11/10/22  Yes Nyoka Lint, PA-C  promethazine-dextromethorphan (PROMETHAZINE-DM) 6.25-15 MG/5ML syrup Take 5 mLs by mouth 4 (four) times daily as needed for cough. 11/10/22  Yes Nyoka Lint, PA-C  norethindrone-ethinyl estradiol (LOESTRIN FE) 1-20 MG-MCG tablet Take 1 tablet by mouth daily. 07/11/20   Joseph Pierini, MD  omeprazole (PRILOSEC) 40 MG capsule Take 40 mg by mouth daily.    [provider]    Family History Family History  Problem Relation  Age of Onset   Ovarian cancer Maternal Aunt    Diabetes Paternal Aunt    Breast cancer Paternal Aunt        x 2   Diabetes Paternal Uncle    Diabetes Maternal Grandmother    Colon cancer Maternal Grandmother    Heart disease Maternal Grandmother    Heart disease Maternal Grandfather    Diabetes Paternal Grandmother    Heart disease Paternal Grandmother    Diabetes Paternal Grandfather    Heart disease Paternal Grandfather     Social History Social History   Tobacco Use   Smoking status: Former    Packs/day: 0.50    Types: Cigarettes   Smokeless tobacco: Never  Vaping Use   Vaping Use: Former  Substance Use Topics   Alcohol use: Yes    Alcohol/week: 0.0 standard drinks of alcohol    Comment: Rare   Drug  use: No    Comment: past marijuana     Allergies   Patient has no known allergies.   Review of Systems Review of Systems  Constitutional:  Positive for chills, fatigue and fever.  HENT:  Positive for postnasal drip, rhinorrhea and sinus pressure.   Respiratory:  Positive for cough, shortness of breath and wheezing.      Physical Exam Triage Vital Signs ED Triage Vitals  Enc Vitals Group     BP 11/10/22 1542 (!) 143/81     Pulse Rate 11/10/22 1542 (!) 124     Resp 11/10/22 1542 20     Temp 11/10/22 1542 (!) 102.4 F (39.1 C)     Temp Source 11/10/22 1542 Oral     SpO2 11/10/22 1542 95 %     Weight --      Height --      Head Circumference --      Peak Flow --      Pain Score 11/10/22 1545 8     Pain Loc --      Pain Edu? --      Excl. in GC? --    No data found.  Updated Vital Signs BP (!) 143/81 (BP Location: Left Arm)   Pulse (!) 124   Temp (!) 102.4 F (39.1 C) (Oral)   Resp 20   SpO2 95%   Visual Acuity Right Eye Distance:   Left Eye Distance:   Bilateral Distance:    Right Eye Near:   Left Eye Near:    Bilateral Near:     Physical Exam Constitutional:      Appearance: Normal appearance.  HENT:     Right Ear: Tympanic membrane and ear canal normal.     Left Ear: Tympanic membrane and ear canal normal.     Mouth/Throat:     Mouth: Mucous membranes are moist.     Pharynx: Oropharynx is clear.  Cardiovascular:     Rate and Rhythm: Normal rate and regular rhythm.     Heart sounds: Normal heart sounds.  Pulmonary:     Effort: Pulmonary effort is normal.     Breath sounds: Normal breath sounds and air entry. No wheezing, rhonchi or rales.  Lymphadenopathy:     Cervical: No cervical adenopathy.  Neurological:     Mental Status: She is alert.      UC Treatments / Results  Labs (all labs ordered are listed, but only abnormal results are displayed) Labs Reviewed  POCT INFLUENZA A/B - Abnormal; Notable for the following components:  Result Value   Influenza A, POC Positive (*)    All other components within normal limits  SARS CORONAVIRUS 2 (TAT 6-24 HRS)    EKG   Radiology No results found.  Procedures Procedures (including critical care time)  Medications Ordered in UC Medications  acetaminophen (TYLENOL) tablet 1,000 mg (1,000 mg Oral Given 11/10/22 1548)  albuterol (VENTOLIN HFA) 108 (90 Base) MCG/ACT inhaler 2 puff (2 puffs Inhalation Given 11/10/22 1608)  AeroChamber Plus Flo-Vu Medium MISC 1 each (1 each Other Given 11/10/22 1608)    Initial Impression / Assessment and Plan / UC Course  I have reviewed the triage vital signs and the nursing notes.  Pertinent labs & imaging results that were available during my care of the patient were reviewed by me and considered in my medical decision making (see chart for details).    Pain: 1.  The fever will be treated with the following: A.  Ibuprofen 600 mg every 6 hours on a regular basis to control the fever and body aches. 2.  The acute cough will be treated with the following: A.  Phenergan DM, 1 to 2 teaspoons every 6-8 hours to control cough and congestion. B.  Albuterol inhaler with spacer, 2 puffs every 6 hours on a regular basis to control cough and wheezing. 3.  Screening for COVID-19 will be treated with the following: A.  Treatment may be considered depending on the results of the COVID 19 test. 4.  The flu will be treated with the following: A.  Tamiflu 75 mg twice daily to treat the flu. 5.  Patient advised follow-up PCP or return to urgent care if symptoms fail to improve. Final Clinical Impressions(s) / UC Diagnoses   Final diagnoses:  Fever, unspecified  Acute cough  Flu  Encounter for screening for COVID-19     Discharge Instructions      Advised take ibuprofen 600 mg every 8 hours on a regular basis with food to help control the fever and body aches. Advised to use the albuterol inhaler with spacer, 2 puffs every 6 hours on a regular  basis to control the cough, wheezing, shortness of breath. Advised take the Phenergan DM, 1 to 2 teaspoons every 6-8 hours as needed to control cough and congestion.  Test will be completed in 48 hours.  If you do not get a call from this office that indicates the test is negative, log onto MyChart to be the test results with post in 48 hours.     ED Prescriptions     Medication Sig Dispense Auth. Provider   promethazine-dextromethorphan (PROMETHAZINE-DM) 6.25-15 MG/5ML syrup Take 5 mLs by mouth 4 (four) times daily as needed for cough. 118 mL Nyoka Lint, PA-C   azithromycin (ZITHROMAX Z-PAK) 250 MG tablet 2 tablets initially today and then 1 tablet daily until completed. 6 each Nyoka Lint, PA-C   ibuprofen (ADVIL) 600 MG tablet Take 1 tablet (600 mg total) by mouth every 6 (six) hours as needed. 30 tablet Nyoka Lint, PA-C   oseltamivir (TAMIFLU) 75 MG capsule Take 1 capsule (75 mg total) by mouth every 12 (twelve) hours. 10 capsule Nyoka Lint, PA-C      PDMP not reviewed this encounter.   Nyoka Lint, PA-C 11/10/22 [phone]

## 2022-11-10 NOTE — ED Triage Notes (Signed)
Pt presents with generalized body aches, chills, fever, headache and non productive cough X 2 days.

## 2022-11-12 LAB — SARS CORONAVIRUS 2 (TAT 6-24 HRS): SARS Coronavirus 2: NEGATIVE

## 2023-01-08 NOTE — Progress Notes (Signed)
GYNECOLOGY  VISIT   HPI: 32 y.o.   Significant Other   American [place] or [place] Native female   G0P0 with Patient's last menstrual period was 01/05/2023.   here for   yeast. Pt used OTC Monistat to clear up, is no longer having itching, burning, or odor, but still has discharge.   Wants STD screening.   Period was 2 weeks ago.  She missed pills for 2 days in a row, and then bled all last week.   Last intercourse was about 16 days ago.  Missed pills were 1 week after last intercourse.   Moved back from [place].  Works in phlebotomy.   GYNECOLOGIC HISTORY: Patient's last menstrual period was 01/05/2023. Contraception:  OCP Menopausal hormone therapy:  n/a Last mammogram:  n/a Last pap smear:   07/11/20 LSIL.  Pap x 2 normal in [place].         OB History     Gravida  0   Para      Term      Preterm      AB      Living         SAB      IAB      Ectopic      Multiple      Live Births                 Patient Active Problem List   Diagnosis Date Noted   Pyelonephritis of right kidney 01/12/2020   Acid reflux 01/12/2020   Tobacco abuse 01/12/2020   Alcohol abuse 01/12/2020   BV (bacterial vaginosis) 01/16/2017    Past Medical History:  Diagnosis Date   GERD (gastroesophageal reflux disease)     Past Surgical History:  Procedure Laterality Date   BUNIONECTOMY Bilateral     Current Outpatient Medications  Medication Sig Dispense Refill   ibuprofen (ADVIL) 600 MG tablet Take 1 tablet (600 mg total) by mouth every 6 (six) hours as needed. 30 tablet 0   norethindrone-ethinyl estradiol (LOESTRIN FE) 1-20 MG-MCG tablet Take 1 tablet by mouth daily. 84 tablet 4   omeprazole (PRILOSEC) 40 MG capsule Take 40 mg by mouth daily.     No current facility-administered medications for this visit.     ALLERGIES: Patient has no known allergies.  Family History  Problem Relation Age of Onset   Diabetes Mother    Diabetes Maternal Grandmother    Colon cancer  Maternal Grandmother    Heart disease Maternal Grandmother    Heart disease Maternal Grandfather    Diabetes Paternal Grandmother    Heart disease Paternal Grandmother    Diabetes Paternal Grandfather    Heart disease Paternal Grandfather    Ovarian cancer Maternal Aunt    Diabetes Paternal Aunt    Breast cancer Paternal Aunt        x 2   Diabetes Paternal Uncle     Social History   Socioeconomic History   Marital status: Significant Other    Spouse name: Not on file   Number of children: 0   Years of education: Not on file   Highest education level: Not on file  Occupational History   Occupation: customer service  Tobacco Use   Smoking status: Former    Packs/day: 0.50    Types: Cigarettes   Smokeless tobacco: Never  Vaping Use   Vaping Use: Former  Substance and Sexual Activity   Alcohol use: Yes    Alcohol/week: 0.0 standard drinks  of alcohol    Comment: Rare   Drug use: No    Comment: past marijuana   Sexual activity: Yes    Birth control/protection: Pill    Comment:   INTERCOURSE AGE 12, SEXUAL PARTNERS LESS THAN 5  Other Topics Concern   Not on file  Social History Narrative   Not on file   Social Determinants of Health   Financial Resource Strain: Not on file  Food Insecurity: Not on file  Transportation Needs: Not on file  Physical Activity: Not on file  Stress: Not on file  Social Connections: Not on file  Intimate Partner Violence: Not on file    Review of Systems  Genitourinary:  Positive for vaginal discharge.    PHYSICAL EXAMINATION:    BP 122/80 (BP Location: Left Arm, Patient Position: Sitting, Cuff Size: Normal)   Ht '5\' 6"'$  (1.676 m)   Wt 136 lb (61.7 kg)   LMP 01/05/2023   BMI 21.95 kg/m     General appearance: alert, cooperative and appears stated age   Pelvic: External genitalia:  no lesions              Urethra:  normal appearing urethra with no masses, tenderness or lesions              Bartholins and Skenes: normal                  Vagina: normal appearing vagina with normal color and discharge, no lesions              Cervix: no lesions                Bimanual Exam:  Uterus:  normal size, contour, position, consistency, mobility, non-tender              Adnexa: no mass, fullness, tenderness             Chaperone was present for exam:  Raquel Sarna  ASSESSMENT  Irregular bleeding.  Missed pills.  STD screening.   PLAN  Serum hCG.  Testing for GC/CT/trich/BV and yeast.  Testing for HIV, syphilis, hep C.  Will get copy of her paps from her provider in [place].  Return for annual exam 02/09/23 and prn.    An After Visit Summary was printed and given to the patient.

## 2023-01-12 ENCOUNTER — Encounter: Payer: Self-pay | Admitting: Obstetrics and Gynecology

## 2023-01-12 ENCOUNTER — Ambulatory Visit (INDEPENDENT_AMBULATORY_CARE_PROVIDER_SITE_OTHER): Payer: BC Managed Care – PPO | Admitting: Obstetrics and Gynecology

## 2023-01-12 ENCOUNTER — Other Ambulatory Visit (HOSPITAL_COMMUNITY)
Admission: RE | Admit: 2023-01-12 | Discharge: 2023-01-12 | Disposition: A | Discharge status: Home / Self Care | Payer: BC Managed Care – PPO | Source: Ambulatory Visit | Attending: Obstetrics and Gynecology | Admitting: Obstetrics and Gynecology

## 2023-01-12 VITALS — BP 122/80 | Ht 66.0 in | Wt 136.0 lb

## 2023-01-12 DIAGNOSIS — Z113 Encounter for screening for infections with a predominantly sexual mode of transmission: Secondary | ICD-10-CM | POA: Diagnosis not present

## 2023-01-12 DIAGNOSIS — Z114 Encounter for screening for human immunodeficiency virus [HIV]: Secondary | ICD-10-CM | POA: Diagnosis not present

## 2023-01-12 DIAGNOSIS — N898 Other specified noninflammatory disorders of vagina: Secondary | ICD-10-CM

## 2023-01-12 DIAGNOSIS — N926 Irregular menstruation, unspecified: Secondary | ICD-10-CM | POA: Diagnosis not present

## 2023-01-12 DIAGNOSIS — Z1159 Encounter for screening for other viral diseases: Secondary | ICD-10-CM

## 2023-01-13 LAB — CERVICOVAGINAL ANCILLARY ONLY
Bacterial Vaginitis (gardnerella): NEGATIVE
Candida Glabrata: NEGATIVE
Candida Vaginitis: NEGATIVE
Chlamydia: NEGATIVE
Comment: NEGATIVE
Comment: NEGATIVE
Comment: NEGATIVE
Comment: NEGATIVE
Comment: NEGATIVE
Comment: NORMAL
Neisseria Gonorrhea: NEGATIVE
Trichomonas: NEGATIVE

## 2023-01-13 LAB — RPR: RPR Ser Ql: NONREACTIVE

## 2023-01-13 LAB — HEPATITIS C ANTIBODY: Hepatitis C Ab: NONREACTIVE

## 2023-01-13 LAB — HIV ANTIBODY (ROUTINE TESTING W REFLEX): HIV 1&2 Ab, 4th Generation: NONREACTIVE

## 2023-01-13 LAB — HCG, QUANTITATIVE, PREGNANCY: HCG, Total, QN: <5 m[IU]/mL

## 2023-02-09 ENCOUNTER — Ambulatory Visit (INDEPENDENT_AMBULATORY_CARE_PROVIDER_SITE_OTHER): Payer: BC Managed Care – PPO | Admitting: Nurse Practitioner

## 2023-02-09 ENCOUNTER — Encounter: Payer: Self-pay | Admitting: Nurse Practitioner

## 2023-02-09 VITALS — BP 120/82 | HR 78 | Ht 65.0 in | Wt 138.0 lb

## 2023-02-09 DIAGNOSIS — N898 Other specified noninflammatory disorders of vagina: Secondary | ICD-10-CM | POA: Diagnosis not present

## 2023-02-09 DIAGNOSIS — N760 Acute vaginitis: Secondary | ICD-10-CM

## 2023-02-09 DIAGNOSIS — Z3041 Encounter for surveillance of contraceptive pills: Secondary | ICD-10-CM

## 2023-02-09 DIAGNOSIS — Z01419 Encounter for gynecological examination (general) (routine) without abnormal findings: Secondary | ICD-10-CM

## 2023-02-09 LAB — WET PREP FOR TRICH, YEAST, CLUE

## 2023-02-09 MED ORDER — NORETHIN ACE-ETH ESTRAD-FE 1-20 MG-MCG PO TABS: 1.0000 | ORAL_TABLET | Freq: Every day | ORAL | 4 refills | Status: AC

## 2023-02-09 MED ORDER — METRONIDAZOLE 500 MG PO TABS: 500.0000 mg | ORAL_TABLET | Freq: Two times a day (BID) | ORAL | 0 refills | Status: DC

## 2023-02-09 NOTE — Progress Notes (Signed)
   Kellie [name] Sep 16, 1991 [id number]   History:  32 y.o. G0 presents for annual exam. Monthly cycles. Complains of vaginal discharge that comes and goes. Sometimes it is malodorous. Feels it is related to intercourse. Negative STD screening and vaginitis pane 01/12/2023. 07/2020 LGSIL neg HR HPV, CIN-1 on biopsy. Normal pap 02/2021, 04/2022 in [place].   Gynecologic History Patient's last menstrual period was 01/20/2023. Period Cycle (Days): 28 Period Duration (Days): 4 Period Pattern: Regular Menstrual Flow: Light Menstrual Control: Tampon Menstrual Control Change Freq (Hours): 6 Dysmenorrhea: (!) Mild Dysmenorrhea Symptoms: Cramping Contraception/Family planning: OCP (estrogen/progesterone) Sexually active: Yes  Health Maintenance Last Pap: 04/22/2022. Results were: Normal, 3-year repeat Last mammogram: Not indicated Last colonoscopy: Not indicated Last Dexa: Not indicated  Past medical history, past surgical history, family history and social history were all reviewed and documented in the EPIC chart. Phlebotomist.   ROS:  A ROS was performed and pertinent positives and negatives are included.  Exam:  Vitals:   02/09/23 1437  BP: 120/82  Pulse: 78  SpO2: 100%  Weight: 138 lb (62.6 kg)  Height: 5\' 5"  (1.651 m)   Body mass index is 22.96 kg/m.  General appearance:  Normal Thyroid:  Symmetrical, normal in size, without palpable masses or nodularity. Respiratory  Auscultation:  Clear without wheezing or rhonchi Cardiovascular  Auscultation:  Regular rate, without rubs, murmurs or gallops  Edema/varicosities:  Not grossly evident Abdominal  Soft,nontender, without masses, guarding or rebound.  Liver/spleen:  No organomegaly noted  Hernia:  None appreciated  Skin  Inspection:  Grossly normal Breasts: Examined lying and sitting.   Right: Without masses, retractions, nipple discharge or axillary adenopathy.   Left: Without masses, retractions, nipple discharge or  axillary adenopathy. Genitourinary   Inguinal/mons:  Normal without inguinal adenopathy  External genitalia:  Normal appearing vulva with no masses, tenderness, or lesions  BUS/Urethra/Skene's glands:  Normal  Vagina:  Normal appearing with normal color and discharge, no lesions  Cervix:  Normal appearing without discharge or lesions  Uterus:  Normal in size, shape and contour.  Midline and mobile, nontender  Adnexa/parametria:     Rt: Normal in size, without masses or tenderness.   Lt: Normal in size, without masses or tenderness.  Anus and perineum: Normal  Digital rectal exam: Deferred  Patient informed chaperone available to be present for breast and pelvic exam. Patient has requested no chaperone to be present. Patient has been advised what will be completed during breast and pelvic exam.   Wet prep + clue cells (+ odor)  Assessment/Plan:  32 y.o. G0 for annual exam.   Well female exam with routine gynecological exam - Education provided on SBEs, importance of preventative screenings, current guidelines, high calcium diet, regular exercise, and multivitamin daily.   Encounter for surveillance of contraceptive pills - Plan: norethindrone-ethinyl estradiol-FE (LOESTRIN FE) 1-20 MG-MCG tablet daily. Taking as prescribed. Refill x 1 year provided.   Vaginal discharge - Plan: WET PREP FOR [place], YEAST, CLUE. + clue cells  BV (bacterial vaginosis) - Plan: metroNIDAZOLE (FLAGYL) 500 MG tablet BID x 7 days. One week after completing treatment begin boric acid vaginal suppositories twice weekly, avoid harsh soaps/body washes, probiotic.   Screening for cervical cancer -  07/2020 LGSIL neg HR HPV, CIN-1 on biopsy. Normal pap 02/2021, 04/2022 in [place]. Will repeat at 3-year interval per guidelines.  Return in 1 year for annual.     Tamela Gammon DNP, 3:12 PM 02/09/2023

## 2023-03-25 ENCOUNTER — Other Ambulatory Visit: Payer: Self-pay | Admitting: Nurse Practitioner

## 2023-03-25 ENCOUNTER — Telehealth: Payer: Self-pay

## 2023-03-25 DIAGNOSIS — N760 Acute vaginitis: Secondary | ICD-10-CM

## 2023-03-25 MED ORDER — METRONIDAZOLE 500 MG PO TABS: 500.0000 mg | ORAL_TABLET | Freq: Two times a day (BID) | ORAL | 0 refills | Status: AC

## 2023-03-25 NOTE — Telephone Encounter (Signed)
Will send in Flagyl. If her symptoms return or persist she will need to be seen. Thanks.

## 2023-03-25 NOTE — Telephone Encounter (Signed)
Pt advised and voiced understanding and appreciation. Pt also was inquiring about boric acid suppositories. Pt notified that after completion of flagyl, she can start boric acid vag suppositories twice weekly per TW notes from recent visit.   Pt then also inquired about OTC washes. Pt advised that lesser is better when it comes to our vaginal/vulvar areas and we dont want to "over do it" and potentially strip away our good bacteria that is present. Pt advised to complete course of flagyl, then boric acid and if desires to change up then to reach back out for alternate recommendation. Pt voiced understanding. Will route to provider for final review and close encounter.

## 2023-03-25 NOTE — Telephone Encounter (Signed)
Pt calling to report sxs of vaginal odor. Denies discharge or any itching/irritation. 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   Pt advised that we typically require an OV w/ an evaluation for prescription medications. However, advised that I will inquire from provider and let her know. Please advise.

## 2023-10-15 DIAGNOSIS — F339 Major depressive disorder, recurrent, unspecified: Secondary | ICD-10-CM | POA: Diagnosis not present

## 2023-10-15 DIAGNOSIS — F5101 Primary insomnia: Secondary | ICD-10-CM | POA: Diagnosis not present

## 2023-10-15 DIAGNOSIS — F4011 Social phobia, generalized: Secondary | ICD-10-CM | POA: Diagnosis not present

## 2023-10-15 DIAGNOSIS — K219 Gastro-esophageal reflux disease without esophagitis: Secondary | ICD-10-CM | POA: Diagnosis not present

## 2024-06-22 ENCOUNTER — Telehealth: Payer: Self-pay | Admitting: *Deleted

## 2024-06-22 NOTE — Telephone Encounter (Signed)
 Patient left message at [phone] requesting call back. No details provided.   Left message to call [facility] Triage at [phone], option 4.

## 2024-06-29 NOTE — Telephone Encounter (Signed)
 Spoke with patient. Patient states no concerns, call not needed. States she reached out to her PCP as well and abx for vaginal symptoms prescribed. Patient aware to return call if symptoms do not resolve.   Routing to provider for final review. Patient is agreeable to disposition. Will close encounter.
